# Patient Record
Sex: Male | Born: 1985 | Hispanic: Yes | Marital: Single | Smoking: Current every day smoker
Health system: Southern US, Community
[De-identification: ages and names within clinical notes are randomized; demographics above are authoritative.]

## PROBLEM LIST (undated history)

## (undated) DIAGNOSIS — E119 Type 2 diabetes mellitus without complications: Secondary | ICD-10-CM

---

## 2019-01-28 ENCOUNTER — Other Ambulatory Visit: Payer: Self-pay

## 2019-01-28 ENCOUNTER — Emergency Department (HOSPITAL_COMMUNITY)
Admission: EM | Admit: 2019-01-28 | Discharge: 2019-01-31 | Disposition: A | Discharge status: Other Acute Inpatient Hospital | Payer: Self-pay | Attending: Emergency Medicine | Admitting: Emergency Medicine

## 2019-01-28 ENCOUNTER — Encounter (HOSPITAL_COMMUNITY): Payer: Self-pay | Admitting: Emergency Medicine

## 2019-01-28 DIAGNOSIS — Z79899 Other long term (current) drug therapy: Secondary | ICD-10-CM | POA: Insufficient documentation

## 2019-01-28 DIAGNOSIS — R739 Hyperglycemia, unspecified: Secondary | ICD-10-CM

## 2019-01-28 DIAGNOSIS — E1165 Type 2 diabetes mellitus with hyperglycemia: Secondary | ICD-10-CM | POA: Insufficient documentation

## 2019-01-28 DIAGNOSIS — F332 Major depressive disorder, recurrent severe without psychotic features: Secondary | ICD-10-CM | POA: Insufficient documentation

## 2019-01-28 DIAGNOSIS — F32A Depression, unspecified: Secondary | ICD-10-CM

## 2019-01-28 DIAGNOSIS — F329 Major depressive disorder, single episode, unspecified: Secondary | ICD-10-CM

## 2019-01-28 DIAGNOSIS — Z1159 Encounter for screening for other viral diseases: Secondary | ICD-10-CM | POA: Insufficient documentation

## 2019-01-28 DIAGNOSIS — Z794 Long term (current) use of insulin: Secondary | ICD-10-CM | POA: Insufficient documentation

## 2019-01-28 HISTORY — DX: Type 2 diabetes mellitus without complications: E11.9

## 2019-01-28 LAB — CBC WITH DIFFERENTIAL/PLATELET
Abs Immature Granulocytes: 0.02 10*3/uL (ref 0.00–0.07)
Basophils Absolute: 0.1 10*3/uL (ref 0.0–0.1)
Basophils Relative: 1 %
Eosinophils Absolute: 0.2 10*3/uL (ref 0.0–0.5)
Eosinophils Relative: 2 %
HCT: 44.7 % (ref 39.0–52.0)
Hemoglobin: 14.3 g/dL (ref 13.0–17.0)
Immature Granulocytes: 0 %
Lymphocytes Relative: 24 %
Lymphs Abs: 1.8 10*3/uL (ref 0.7–4.0)
MCH: 28.8 pg (ref 26.0–34.0)
MCHC: 32 g/dL (ref 30.0–36.0)
MCV: 90.1 fL (ref 80.0–100.0)
Monocytes Absolute: 0.5 10*3/uL (ref 0.1–1.0)
Monocytes Relative: 7 %
Neutro Abs: 5.1 10*3/uL (ref 1.7–7.7)
Neutrophils Relative %: 66 %
Platelets: 252 10*3/uL (ref 150–400)
RBC: 4.96 MIL/uL (ref 4.22–5.81)
RDW: 15 % (ref 11.5–15.5)
WBC: 7.6 10*3/uL (ref 4.0–10.5)
nRBC: 0 % (ref 0.0–0.2)

## 2019-01-28 LAB — POCT I-STAT EG7
Acid-base deficit: 2 mmol/L (ref 0.0–2.0)
Bicarbonate: 22 mmol/L (ref 20.0–28.0)
Calcium, Ion: 1.14 mmol/L — ABNORMAL LOW (ref 1.15–1.40)
HCT: 48 % (ref 39.0–52.0)
Hemoglobin: 16.3 g/dL (ref 13.0–17.0)
O2 Saturation: 96 %
Potassium: 3.8 mmol/L (ref 3.5–5.1)
Sodium: 138 mmol/L (ref 135–145)
TCO2: 23 mmol/L (ref 22–32)
pCO2, Ven: 34.7 mmHg — ABNORMAL LOW (ref 44.0–60.0)
pH, Ven: 7.411 (ref 7.250–7.430)
pO2, Ven: 78 mmHg — ABNORMAL HIGH (ref 32.0–45.0)

## 2019-01-28 LAB — URINALYSIS, ROUTINE W REFLEX MICROSCOPIC
Bacteria, UA: NONE SEEN
Bilirubin Urine: NEGATIVE
Glucose, UA: >=500 mg/dL — AB
Hgb urine dipstick: NEGATIVE
Ketones, ur: 20 mg/dL — AB
Leukocytes,Ua: NEGATIVE
Nitrite: NEGATIVE
Protein, ur: NEGATIVE mg/dL
Specific Gravity, Urine: 1.035 — ABNORMAL HIGH (ref 1.005–1.030)
pH: 6 (ref 5.0–8.0)

## 2019-01-28 LAB — RAPID URINE DRUG SCREEN, HOSP PERFORMED
Amphetamines: NOT DETECTED
Barbiturates: NOT DETECTED
Benzodiazepines: NOT DETECTED
Cocaine: NOT DETECTED
Opiates: NOT DETECTED
Tetrahydrocannabinol: POSITIVE — AB

## 2019-01-28 LAB — CBG MONITORING, ED
Glucose-Capillary: 453 mg/dL — ABNORMAL HIGH (ref 70–99)
Glucose-Capillary: 318 mg/dL — ABNORMAL HIGH (ref 70–99)
Glucose-Capillary: 520 mg/dL (ref 70–99)
Glucose-Capillary: 287 mg/dL — ABNORMAL HIGH (ref 70–99)
Glucose-Capillary: 215 mg/dL — ABNORMAL HIGH (ref 70–99)
Glucose-Capillary: 71 mg/dL (ref 70–99)

## 2019-01-28 LAB — COMPREHENSIVE METABOLIC PANEL
ALT: 97 U/L — ABNORMAL HIGH (ref 0–44)
AST: 71 U/L — ABNORMAL HIGH (ref 15–41)
Albumin: 4 g/dL (ref 3.5–5.0)
Alkaline Phosphatase: 72 U/L (ref 38–126)
Anion gap: 14 (ref 5–15)
BUN: 17 mg/dL (ref 6–20)
CO2: 21 mmol/L — ABNORMAL LOW (ref 22–32)
Calcium: 9.6 mg/dL (ref 8.9–10.3)
Chloride: 101 mmol/L (ref 98–111)
Creatinine, Ser: 1 mg/dL (ref 0.61–1.24)
GFR calc Af Amer: >60 mL/min (ref >60)
GFR calc non Af Amer: >60 mL/min (ref >60)
Glucose, Bld: 469 mg/dL — ABNORMAL HIGH (ref 70–99)
Potassium: 3.8 mmol/L (ref 3.5–5.1)
Sodium: 136 mmol/L (ref 135–145)
Total Bilirubin: 0.9 mg/dL (ref 0.3–1.2)
Total Protein: 7.4 g/dL (ref 6.5–8.1)

## 2019-01-28 LAB — SARS CORONAVIRUS 2: SARS Coronavirus 2: NOT DETECTED

## 2019-01-28 LAB — HEMOGLOBIN A1C
Hgb A1c MFr Bld: 10.4 % — ABNORMAL HIGH (ref 4.8–5.6)
Mean Plasma Glucose: 251.78 mg/dL

## 2019-01-28 LAB — ETHANOL: Alcohol, Ethyl (B): <10 mg/dL (ref <10)

## 2019-01-28 LAB — SALICYLATE LEVEL: Salicylate Lvl: <7 mg/dL (ref 2.8–30.0)

## 2019-01-28 LAB — LIPASE, BLOOD: Lipase: 35 U/L (ref 11–51)

## 2019-01-28 LAB — ACETAMINOPHEN LEVEL: Acetaminophen (Tylenol), Serum: <10 ug/mL — ABNORMAL LOW (ref 10–30)

## 2019-01-28 MED ORDER — INSULIN ASPART 100 UNIT/ML ~~LOC~~ SOLN: 0.0000 [IU] | Freq: Three times a day (TID) | SUBCUTANEOUS | Status: DC

## 2019-01-28 MED ORDER — SODIUM CHLORIDE 0.9 % IV BOLUS
1000.0000 mL | Freq: Once | INTRAVENOUS | Status: AC
  Administered 2019-01-28: 1000 mL via INTRAVENOUS

## 2019-01-28 MED ORDER — INSULIN ASPART 100 UNIT/ML ~~LOC~~ SOLN
35.0000 [IU] | Freq: Two times a day (BID) | SUBCUTANEOUS | Status: DC
  Administered 2019-01-28 – 2019-01-29 (×2): 35 [IU] via SUBCUTANEOUS

## 2019-01-28 MED ORDER — QUETIAPINE FUMARATE 50 MG PO TABS
300.0000 mg | ORAL_TABLET | Freq: Two times a day (BID) | ORAL | Status: DC
  Administered 2019-01-28 – 2019-01-31 (×6): 300 mg via ORAL
  Filled 2019-01-28 (×6): qty 2

## 2019-01-28 MED ORDER — BUPRENORPHINE HCL-NALOXONE HCL 8-2 MG SL SUBL
1.0000 | SUBLINGUAL_TABLET | Freq: Two times a day (BID) | SUBLINGUAL | Status: DC
  Administered 2019-01-28 – 2019-01-31 (×6): 1 via SUBLINGUAL
  Filled 2019-01-28 (×6): qty 1

## 2019-01-28 MED ORDER — SERTRALINE HCL 100 MG PO TABS
200.0000 mg | ORAL_TABLET | Freq: Every day | ORAL | Status: DC
  Administered 2019-01-28 – 2019-01-31 (×4): 200 mg via ORAL
  Filled 2019-01-28 (×4): qty 2

## 2019-01-28 MED ORDER — INSULIN ASPART 100 UNIT/ML ~~LOC~~ SOLN
10.0000 [IU] | Freq: Once | SUBCUTANEOUS | Status: AC
  Administered 2019-01-28: 10 [IU] via SUBCUTANEOUS

## 2019-01-28 MED ORDER — TRAZODONE HCL 100 MG PO TABS
100.0000 mg | ORAL_TABLET | Freq: Every day | ORAL | Status: DC
  Administered 2019-01-28 – 2019-01-30 (×3): 100 mg via ORAL
  Filled 2019-01-28 (×3): qty 1

## 2019-01-28 MED ORDER — INSULIN ASPART 100 UNIT/ML ~~LOC~~ SOLN: 0.0000 [IU] | Freq: Every day | SUBCUTANEOUS | Status: DC

## 2019-01-28 NOTE — ED Triage Notes (Signed)
Pt in with c/o high blood sugar, has been without meds x 6 days. Hx of DM2, has been unable to check sugar. C/o dizziness, thirst and HA

## 2019-01-28 NOTE — ED Notes (Signed)
Accepted to [facility] when CBG is less than 300 and EDP cleared

## 2019-01-28 NOTE — ED Notes (Signed)
Security has wanded the patient  

## 2019-01-28 NOTE — ED Notes (Signed)
Pt states that he has not taken any meds for a few days-- hearing voices that tell him to hurt himself and how to do it.  States meds are:  Suboxone 8mg  BID Zoloft 200mg  qd Wellbutrin 300mg  qd Seraquel 300mg  BID Trazadone 100mg  hs Abilify (not sure of dose) Insulin 70/30

## 2019-01-28 NOTE — ED Notes (Signed)
Pt states he was recently in [place] for psych treatment after attempting to hurt himself. Reports he has lost his job, apt and has no family. States he has been out of his psych medication.

## 2019-01-28 NOTE — Progress Notes (Addendum)
Pt accepted to [facility], Bed 307-1, ONCE PATIENT 'S CBG <300 AND PATIENT IS MEDICALLY CLEARED BY EDP- Marvia Pickles, NP is the accepting provider.  Myles Lipps, MD is the attending provider.  Call report to [phone]  Kehinde @ [facility] Psych ED notified.   Pt is Voluntary.  Pt may be transported by [facility]   Pt scheduled  to arrive at [facility] 'S CBG <300 AND PATIENT IS MEDICALLY CLEARED BY EDP.  Areatha Keas. Judi Cong, MSW, [place] Disposition Clinical Social Work [phone] (cell) [phone] (office)

## 2019-01-28 NOTE — BH Assessment (Signed)
Tele Assessment Note   Patient Name: Donald HenleDaniel Hartman MRN: [id number] Referring Physician: Dr. Silverio LayYao Location of Patient: [facility] Location of Provider: Behavioral Health TTS Department  Donald Hartman is an 33 y.o. male. Pt came to the ED reporting issues with his diabetes and MH. Pt reports SI with no plan. Pt reports 1 previous SI attempt. Pt denies HI. Pt reports AVH.  Per Pt he was recently released from [facility] and he was not given prescriptions for his diabetes and MH. The Pt states that he is from [place] but he recently lost his job and home so [facility] [place] [facility] [place]. The Pt is currently homeless. The Pt reports SA. 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 Per Pt he feels he is need of rehab.  Donald Beamravis, NP recommends inpatient treatment.  Diagnosis:  F33.2 MDD  Past Medical History:  Past Medical History:  Diagnosis Date  . Diabetes mellitus without complication (HCC)      Family History: No family history on file.  Social History:  has no history on file for tobacco, alcohol, and drug.  Additional Social History:  Alcohol / Drug Use Pain Medications: please see mar Prescriptions: please see mar Over the Counter: please see mar History of alcohol / drug use?: Yes  CIWA: CIWA-Ar BP: 105/71 Pulse Rate: 92 COWS:    Allergies: Not on File  Home Medications: (Not in a hospital admission)   OB/GYN Status:  No LMP for male patient.  General Assessment Data [facility] Assessment: In system Is this a Tele or Face-to-Face Assessment?: Tele Assessment Is this an Initial Assessment or a Re-assessment for this encounter?: Initial Assessment Patient Accompanied by:: N/A Language Other than English: Yes What is your preferred language: Spanish Living Arrangements: Homeless/Shelter What gender do you identify as?: Male Marital status: Single Maiden name: NA Pregnancy Status: No Living Arrangements: Alone Can pt return to current  living arrangement?: Yes Admission Status: Voluntary Is patient capable of signing voluntary admission?: Yes Referral Source: Self/Family/Friend Insurance type: SP     Crisis Care Plan Living Arrangements: Alone Legal Guardian: Other:(self) Name of Psychiatrist: NA Name of Therapist: NA  Education Status Is patient currently in school?: No Is the patient employed, unemployed or receiving disability?: Unemployed  Risk to self with the past 6 months Suicidal Ideation: Yes-Currently Present Has patient been a risk to self within the past 6 months prior to admission? : Yes Suicidal Intent: Yes-Currently Present Has patient had any suicidal intent within the past 6 months prior to admission? : Yes Is patient at risk for suicide?: Yes Suicidal Plan?: No Has patient had any suicidal plan within the past 6 months prior to admission? : No Access to Means: No What has been your use of drugs/alcohol within the last 12 months?: NA Previous Attempts/Gestures: Yes How many times?: 1 Other Self Harm Risks: NA Triggers for Past Attempts: None known Intentional Self Injurious Behavior: None Family Suicide History: No Recent stressful life event(s): Other (Comment) Persecutory voices/beliefs?: No Depression: Yes Depression Symptoms: Isolating, Fatigue, Loss of interest in usual pleasures, Feeling angry/irritable Substance abuse history and/or treatment for substance abuse?: No Suicide prevention information given to non-admitted patients: Not applicable  Risk to Others within the past 6 months Homicidal Ideation: No Does patient have any lifetime risk of violence toward others beyond the six months prior to admission? : No Thoughts of Harm to Others: No Current Homicidal Intent: No Current Homicidal Plan: No Access  to Homicidal Means: No Identified Victim: NA History of harm to others?: No Assessment of Violence: None Noted Violent Behavior Description: NA Does patient have access  to weapons?: No Criminal Charges Pending?: No Does patient have a court date: No Is patient on probation?: No  Psychosis Hallucinations: None noted Delusions: None noted  Mental Status Report Appearance/Hygiene: Unremarkable Eye Contact: Fair Motor Activity: Freedom of movement Speech: Logical/coherent Level of Consciousness: Alert Mood: Depressed, Anxious Affect: Depressed, Anxious Anxiety Level: Minimal Thought Processes: Coherent, Relevant Judgement: Unimpaired Orientation: Person, Place, Time, Situation Obsessive Compulsive Thoughts/Behaviors: None  Cognitive Functioning Concentration: Normal Memory: Recent Intact, Remote Intact Is patient IDD: No Insight: Fair Impulse Control: Fair Appetite: Fair Have you had any weight changes? : No Change Sleep: No Change Total Hours of Sleep: 8 Vegetative Symptoms: None  ADLScreening [facility] Assessment Services) Patient's cognitive ability adequate to safely complete daily activities?: Yes Patient able to express need for assistance with ADLs?: Yes Independently performs ADLs?: Yes (appropriate for developmental age)  Prior Inpatient Therapy Prior Inpatient Therapy: Yes Prior Therapy Dates: 01/2019 Prior Therapy Facilty/Provider(s): [facility] Reason for Treatment: depression  Prior Outpatient Therapy Prior Outpatient Therapy: No Does patient have an ACCT team?: No Does patient have Intensive In-House Services?  : No Does patient have [facility] services? : No Does patient have P4CC services?: No  ADL Screening (condition at time of admission) Patient's cognitive ability adequate to safely complete daily activities?: Yes Is the patient deaf or have difficulty hearing?: No Does the patient have difficulty seeing, even when wearing glasses/contacts?: No Does the patient have difficulty concentrating, remembering, or making decisions?: No Patient able to express need for assistance with ADLs?: Yes Does the patient have  difficulty dressing or bathing?: No Independently performs ADLs?: Yes (appropriate for developmental age)       Abuse/Neglect Assessment (Assessment to be complete while patient is alone) Abuse/Neglect Assessment Can Be Completed: Yes Physical Abuse: Denies Verbal Abuse: Denies Sexual Abuse: Denies Exploitation of patient/patient's resources: Denies     Regulatory affairs officer (For Healthcare) Does Patient Have a Medical Advance Directive?: Yes Does patient want to make changes to medical advance directive?: No - Patient declined          Disposition:  Disposition Initial Assessment Completed for this Encounter: Yes  This service was provided via telemedicine using a 2-way, interactive audio and video technology.  Names of all persons participating in this telemedicine service and their role in this encounter. Name: Darnelle Maffucci Money Role:   Name:  Role:   Name:  Role:   Name:  Role:     Alli Jasmer D 01/28/2019 11:08 AM

## 2019-01-28 NOTE — ED Notes (Signed)
TTS set up for pt 

## 2019-01-28 NOTE — ED Provider Notes (Signed)
[facility] EMER[facility]ENCY [facility] Provider Note   CSN: [id number] Arrival date & time: 01/28/19  [id number]    History   Chief Complaint Chief Complaint  Patient presents with  . Hyperglycemia    HPI Barrett HenleDaniel Pfeifle is a 33 y.o. male history of diabetes, depression here presenting with suicidal ideation, hyperglycemia.  Patient states that he was traveling from [place] to [place] to visit family.  States that he ran out of his psych meds as well as his 70/30 insulin about a week ago.  Apparently patient was admitted at a psych unit in [place] several days ago and just got released about 4 days ago.  At that time, patient was cutting himself and was very depressed.  Patient states that he is still depressed but does not have any current plans to kill himself.  He states that when he was discharged from the psych unit several days ago, his Seroquel and trazodone and insulin were not refilled.  Denies any fevers or chills.  Patient states that he has been very thirsty and has been urinating frequently.     The history is provided by the patient.    Past Medical History:  Diagnosis Date  . Diabetes mellitus without complication (HCC)     There are no active problems to display for this patient.       Home Medications    Prior to Admission medications   Medication Sig Start Date End Date Taking? Authorizing Provider  insulin aspart (NOVOLOG) 100 UNIT/ML injection Inject 35 Units into the skin 2 (two) times daily before a meal.   Yes [provider]    Family History No family history on file.  Social History Social History   Tobacco Use  . Smoking status: Not on file  Substance Use Topics  . Alcohol use: Not on file  . Drug use: Not on file     Allergies   Patient has no allergy information on record.   Review of Systems Review of Systems  Skin: Positive for wound.  Neurological: Positive for dizziness.  Psychiatric/Behavioral: Positive  for dysphoric mood.  All other systems reviewed and are negative.    Physical Exam Updated Vital Signs BP 105/78   Pulse 87   Temp 98.2 F (36.8 C) (Oral)   Resp 15   Ht 5\' 9"  (1.753 m)   Wt 78.9 kg   SpO2 98%   BMI 25.70 kg/m   Physical Exam Vitals signs and nursing note reviewed.  Constitutional:      Comments: Depressed, slightly dehydrated   HENT:     Head: Normocephalic.     Nose: Nose normal.     Mouth/Throat:     Mouth: Mucous membranes are moist.  Eyes:     Extraocular Movements: Extraocular movements intact.     Pupils: Pupils are equal, round, and reactive to light.  Neck:     Musculoskeletal: Normal range of motion.  Cardiovascular:     Rate and Rhythm: Regular rhythm.     Pulses: Normal pulses.     Comments: Slightly tachycardic  Pulmonary:     Effort: Pulmonary effort is normal.     Breath sounds: Normal breath sounds.  Abdominal:     General: Abdomen is flat.     Palpations: Abdomen is soft.  Musculoskeletal: Normal range of motion.     Comments: Healed cut wound on chest and R forearm, no obvious cellulitis   Skin:    Capillary Refill: Capillary refill  takes less than 2 seconds.     Comments: Healing wounds on chest and R forearm   Neurological:     General: No focal deficit present.  Psychiatric:     Comments: Depressed       ED Treatments / Results  Labs (all labs ordered are listed, but only abnormal results are displayed) Labs Reviewed  COMPREHENSIVE METABOLIC PANEL - Abnormal; Notable for the following components:      Result Value   CO2 21 (*)    Glucose, Bld 469 (*)    AST 71 (*)    ALT 97 (*)    All other components within normal limits  URINALYSIS, ROUTINE W REFLEX MICROSCOPIC - Abnormal; Notable for the following components:   Specific Gravity, Urine 1.035 (*)    Glucose, UA >=500 (*)    Ketones, ur 20 (*)    All other components within normal limits  ACETAMINOPHEN LEVEL - Abnormal; Notable for the following components:    Acetaminophen (Tylenol), Serum <10 (*)    All other components within normal limits  RAPID URINE DRUG SCREEN, HOSP PERFORMED - Abnormal; Notable for the following components:   Tetrahydrocannabinol POSITIVE (*)    All other components within normal limits  CBG MONITORING, ED - Abnormal; Notable for the following components:   Glucose-Capillary 453 (*)    All other components within normal limits  POCT I-STAT EG7 - Abnormal; Notable for the following components:   pCO2, Ven 34.7 (*)    pO2, Ven 78.0 (*)    Calcium, Ion 1.14 (*)    All other components within normal limits  CBG MONITORING, ED - Abnormal; Notable for the following components:   Glucose-Capillary 318 (*)    All other components within normal limits  SARS CORONAVIRUS 2  CBC WITH DIFFERENTIAL/PLATELET  LIPASE, BLOOD  ETHANOL  SALICYLATE LEVEL  HEMOGLOBIN A1C    EKG None  Radiology No results found.  Procedures Procedures (including critical care time)  Medications Ordered in ED Medications  insulin aspart (novoLOG) injection 35 Units (has no administration in time range)  insulin aspart (novoLOG) injection 0-15 Units (has no administration in time range)  insulin aspart (novoLOG) injection 0-5 Units (has no administration in time range)  sodium chloride 0.9 % bolus 1,000 mL (0 mLs Intravenous Stopped 01/28/19 1048)  insulin aspart (novoLOG) injection 10 Units (10 Units Subcutaneous Given 01/28/19 0933)     Initial Impression / Assessment and Plan / ED Course  I have reviewed the triage vital signs and the nursing notes.  Pertinent labs & imaging results that were available during my care of the patient were reviewed by me and considered in my medical decision making (see chart for details).       Kimi Kroft is a 33 y.o. male here with hyperglycemia, depression. Recently admitted to a psych hospital in [place] and still depressed but has no suicidal ideation. Also needs refill for his diabetes meds.  Will check labs, give IVF, insulin.   2:09 PM Glucose decreased to 300 from 469. AG normal. Medically cleared, started back on insulin 70/30. Psych recommend inpatient psych admission   Final Clinical Impressions(s) / ED Diagnoses   Final diagnoses:  None    ED Discharge Orders    None       Drenda Freeze, MD 01/28/19 1410

## 2019-01-28 NOTE — ED Triage Notes (Signed)
Pt reports losing DM medication and has not checked it for about 6 days. Reports pain in his back and feet.

## 2019-01-29 LAB — CBG MONITORING, ED
Glucose-Capillary: 305 mg/dL — ABNORMAL HIGH (ref 70–99)
Glucose-Capillary: 356 mg/dL — ABNORMAL HIGH (ref 70–99)
Glucose-Capillary: 153 mg/dL — ABNORMAL HIGH (ref 70–99)
Glucose-Capillary: 410 mg/dL — ABNORMAL HIGH (ref 70–99)
Glucose-Capillary: 318 mg/dL — ABNORMAL HIGH (ref 70–99)

## 2019-01-29 MED ORDER — BUPROPION HCL ER (XL) 150 MG PO TB24
300.0000 mg | ORAL_TABLET | Freq: Every day | ORAL | Status: DC
  Administered 2019-01-29 – 2019-01-31 (×3): 300 mg via ORAL
  Filled 2019-01-29 (×3): qty 2

## 2019-01-29 MED ORDER — INSULIN ASPART PROT & ASPART (70-30 MIX) 100 UNIT/ML ~~LOC~~ SUSP
35.0000 [IU] | Freq: Two times a day (BID) | SUBCUTANEOUS | Status: DC
  Administered 2019-01-29 – 2019-01-30 (×2): 35 [IU] via SUBCUTANEOUS
  Filled 2019-01-29: qty 10

## 2019-01-29 NOTE — ED Notes (Signed)
Pt asking when he will be able to go to [facility]. Told pt that once his blood sugars stay below 300 for a while, he will be able to go. Pt asking for his suboxone.

## 2019-01-29 NOTE — ED Notes (Signed)
Patient was given a Snack and drink. 

## 2019-01-29 NOTE — Progress Notes (Signed)
Patient is still pending bed 307-1 at [facility] but blood sugars need to be controlled more consistently under 300. Reconfirmed with Lowella Petties, RN@[facility]   Donald Hartman. Judi Cong, MSW, [facility] Disposition Clinical Social Work [phone] (cell) [phone] (office)

## 2019-01-29 NOTE — Progress Notes (Signed)
Inpatient Diabetes Program Recommendations  AACE/ADA: New Consensus Statement on Inpatient Glycemic Control (2015)  Target Ranges:  Prepandial:   less than 140 mg/dL      Peak postprandial:   less than 180 mg/dL (1-2 hours)      Critically ill patients:  140 - 180 mg/dL   Lab Results  Component Value Date   GLUCAP 153 (H) 01/29/2019   HGBA1C 10.4 (H) 01/28/2019    Review of Glycemic Control  Inpatient Diabetes Program Recommendations:   -Decrease Novolog 70/30 insulin to 80% home insulin dose = 25 units bid -Add Novolog sensitive correction 0-9 tid  Thank you, Nani Gasser. Kamalei Roeder, RN, MSN, CDE  Diabetes Coordinator Inpatient Glycemic Control Team Team Pager [phone] (8am-5pm) 01/29/2019 1:29 PM

## 2019-01-29 NOTE — ED Notes (Signed)
Pt requesting another snack, at 1430 received snack of cheese and cookies with coffee. Snack times reinforced to pt.

## 2019-01-29 NOTE — ED Notes (Signed)
Pt's last drink was three days ago, will continue to monitor withdrawal symptoms

## 2019-01-29 NOTE — ED Notes (Signed)
Updated BH's AC on pt's CBGs and insulin regimen

## 2019-01-29 NOTE — ED Notes (Signed)
Informed Dr Vanita Panda of pt's sugar, continue to hold sliding scale until lunchtime and recheck then

## 2019-01-29 NOTE — ED Notes (Signed)
Discussed pt's insulin orders with Dr Vanita Panda. Per Dr Vanita Panda hold sliding scale with breakfast and give pt's typical 35u dose, reassess at lunchtime. Per pt normal insulin regimin is 35u twice daily.

## 2019-01-29 NOTE — ED Notes (Signed)
Patient was given Cheese and Short Bread Cookies, and Cup of Black Coffee.

## 2019-01-29 NOTE — ED Notes (Signed)
Patient denies pain and is resting comfortably.  

## 2019-01-29 NOTE — ED Notes (Signed)
Snack and drink provided to pt.  

## 2019-01-30 ENCOUNTER — Encounter (HOSPITAL_COMMUNITY): Payer: Self-pay | Admitting: Registered Nurse

## 2019-01-30 LAB — CBG MONITORING, ED
Glucose-Capillary: 318 mg/dL — ABNORMAL HIGH (ref 70–99)
Glucose-Capillary: 451 mg/dL — ABNORMAL HIGH (ref 70–99)
Glucose-Capillary: 233 mg/dL — ABNORMAL HIGH (ref 70–99)
Glucose-Capillary: 72 mg/dL (ref 70–99)
Glucose-Capillary: 119 mg/dL — ABNORMAL HIGH (ref 70–99)
Glucose-Capillary: 57 mg/dL — ABNORMAL LOW (ref 70–99)
Glucose-Capillary: 79 mg/dL (ref 70–99)

## 2019-01-30 MED ORDER — INSULIN ASPART 100 UNIT/ML ~~LOC~~ SOLN
10.0000 [IU] | Freq: Once | SUBCUTANEOUS | Status: AC
  Administered 2019-01-30: 10 [IU] via SUBCUTANEOUS

## 2019-01-30 MED ORDER — INSULIN ASPART 100 UNIT/ML ~~LOC~~ SOLN: 2.0000 [IU] | Freq: Three times a day (TID) | SUBCUTANEOUS | Status: DC

## 2019-01-30 MED ORDER — INSULIN ASPART PROT & ASPART (70-30 MIX) 100 UNIT/ML ~~LOC~~ SUSP
40.0000 [IU] | Freq: Two times a day (BID) | SUBCUTANEOUS | Status: DC
  Administered 2019-01-30 – 2019-01-31 (×2): 40 [IU] via SUBCUTANEOUS
  Filled 2019-01-30: qty 10

## 2019-01-30 NOTE — ED Notes (Signed)
Per Huel Coventry at [facility] patient may go over after shift change

## 2019-01-30 NOTE — ED Provider Notes (Signed)
Over the past 2 days the patient has had multiple episodes of hyperglycemia. These may be due in part at least to his inconsistent reporting of his home insulin regimen. After several conversations with nursing, pharmacy technicians, patient is seemingly on the appropriate home regimen, with both 7030 and sliding scale.   Donald Muskrat, MD 01/30/19 1332

## 2019-01-30 NOTE — ED Notes (Signed)
Checked patient cbg at 8:53 it was 41 notified RN of blood sugar

## 2019-01-30 NOTE — ED Notes (Signed)
Dr Vanita Panda ordered home insulin sliding scale:  SLIDING SCALE- 150-2 UNITS. 200-4 UNITS  250-6 UNITS  300-400 8 UNITS . 400 12 UNITS 500-600 15 UNITS.

## 2019-01-30 NOTE — ED Notes (Signed)
Per [facility] at [facility] pt may go over to [facility] if sugars continue to trend/stabilize around dinner

## 2019-01-30 NOTE — ED Notes (Signed)
Breakfast tray ordered 

## 2019-01-30 NOTE — ED Notes (Signed)
Checked patient cbg it was 62 notified RN of blood sugar patient is resting with sitter at bedside

## 2019-01-30 NOTE — ED Notes (Signed)
Per [facility] pt will not be able to be transfer there until tomorrow at 8 am if his sugar is below the 300.

## 2019-01-30 NOTE — ED Notes (Signed)
Report attempted, per [facility] pt needs to be at least 24 hours with CBG below 300 and pt was 400 this morning she will consult with the [facility] to find out if is ok for him to leave.

## 2019-01-31 ENCOUNTER — Inpatient Hospital Stay (HOSPITAL_COMMUNITY)
Admission: AD | Admit: 2019-01-31 | Discharge: 2019-02-04 | DRG: 885 | Disposition: A | Discharge status: Home / Self Care | Payer: Federal, State, Local not specified - Other | Source: Intra-hospital | Attending: Psychiatry | Admitting: Psychiatry

## 2019-01-31 ENCOUNTER — Other Ambulatory Visit: Payer: Self-pay

## 2019-01-31 ENCOUNTER — Encounter (HOSPITAL_COMMUNITY): Payer: Self-pay | Admitting: *Deleted

## 2019-01-31 DIAGNOSIS — F1123 Opioid dependence with withdrawal: Secondary | ICD-10-CM | POA: Diagnosis present

## 2019-01-31 DIAGNOSIS — G4700 Insomnia, unspecified: Secondary | ICD-10-CM | POA: Diagnosis present

## 2019-01-31 DIAGNOSIS — Z79899 Other long term (current) drug therapy: Secondary | ICD-10-CM

## 2019-01-31 DIAGNOSIS — E1065 Type 1 diabetes mellitus with hyperglycemia: Secondary | ICD-10-CM | POA: Diagnosis present

## 2019-01-31 DIAGNOSIS — R45851 Suicidal ideations: Secondary | ICD-10-CM | POA: Diagnosis present

## 2019-01-31 DIAGNOSIS — T402X5A Adverse effect of other opioids, initial encounter: Secondary | ICD-10-CM | POA: Diagnosis present

## 2019-01-31 DIAGNOSIS — R41843 Psychomotor deficit: Secondary | ICD-10-CM | POA: Diagnosis present

## 2019-01-31 DIAGNOSIS — F410 Panic disorder [episodic paroxysmal anxiety] without agoraphobia: Secondary | ICD-10-CM | POA: Diagnosis present

## 2019-01-31 DIAGNOSIS — Z915 Personal history of self-harm: Secondary | ICD-10-CM

## 2019-01-31 DIAGNOSIS — F332 Major depressive disorder, recurrent severe without psychotic features: Principal | ICD-10-CM | POA: Diagnosis present

## 2019-01-31 DIAGNOSIS — F1124 Opioid dependence with opioid-induced mood disorder: Secondary | ICD-10-CM

## 2019-01-31 DIAGNOSIS — Z794 Long term (current) use of insulin: Secondary | ICD-10-CM

## 2019-01-31 DIAGNOSIS — R440 Auditory hallucinations: Secondary | ICD-10-CM | POA: Diagnosis present

## 2019-01-31 LAB — CBG MONITORING, ED
Glucose-Capillary: 66 mg/dL — ABNORMAL LOW (ref 70–99)
Glucose-Capillary: 99 mg/dL (ref 70–99)
Glucose-Capillary: 201 mg/dL — ABNORMAL HIGH (ref 70–99)
Glucose-Capillary: 220 mg/dL — ABNORMAL HIGH (ref 70–99)
Glucose-Capillary: 86 mg/dL (ref 70–99)

## 2019-01-31 LAB — GLUCOSE, CAPILLARY
Glucose-Capillary: 447 mg/dL — ABNORMAL HIGH (ref 70–99)
Glucose-Capillary: 155 mg/dL — ABNORMAL HIGH (ref 70–99)

## 2019-01-31 MED ORDER — VITAMIN B-1 100 MG PO TABS
100.0000 mg | ORAL_TABLET | Freq: Every day | ORAL | Status: DC
  Administered 2019-01-31 – 2019-02-04 (×5): 100 mg via ORAL
  Filled 2019-01-31 (×8): qty 1

## 2019-01-31 MED ORDER — CLONIDINE HCL 0.1 MG PO TABS
0.1000 mg | ORAL_TABLET | Freq: Four times a day (QID) | ORAL | Status: AC
  Administered 2019-01-31 – 2019-02-02 (×6): 0.1 mg via ORAL
  Filled 2019-01-31 (×12): qty 1

## 2019-01-31 MED ORDER — QUETIAPINE FUMARATE 300 MG PO TABS
300.0000 mg | ORAL_TABLET | Freq: Every day | ORAL | Status: DC
  Administered 2019-02-01 – 2019-02-04 (×4): 300 mg via ORAL
  Filled 2019-01-31 (×3): qty 1
  Filled 2019-01-31: qty 7
  Filled 2019-01-31 (×3): qty 1
  Filled 2019-01-31: qty 7
  Filled 2019-01-31: qty 1

## 2019-01-31 MED ORDER — LOPERAMIDE HCL 2 MG PO CAPS: 2.0000 mg | ORAL_CAPSULE | ORAL | Status: DC | PRN

## 2019-01-31 MED ORDER — CLONIDINE HCL 0.1 MG PO TABS
0.1000 mg | ORAL_TABLET | Freq: Every day | ORAL | Status: DC
  Filled 2019-01-31 (×2): qty 1

## 2019-01-31 MED ORDER — MAGNESIUM HYDROXIDE 400 MG/5ML PO SUSP: 30.0000 mL | Freq: Every day | ORAL | Status: DC | PRN

## 2019-01-31 MED ORDER — CHLORDIAZEPOXIDE HCL 25 MG PO CAPS
25.0000 mg | ORAL_CAPSULE | Freq: Four times a day (QID) | ORAL | Status: DC | PRN
  Administered 2019-01-31 – 2019-02-04 (×8): 25 mg via ORAL
  Filled 2019-01-31 (×8): qty 1

## 2019-01-31 MED ORDER — TRAZODONE HCL 50 MG PO TABS: 50.0000 mg | ORAL_TABLET | Freq: Every evening | ORAL | Status: DC | PRN

## 2019-01-31 MED ORDER — METHOCARBAMOL 500 MG PO TABS
500.0000 mg | ORAL_TABLET | Freq: Three times a day (TID) | ORAL | Status: DC | PRN
  Administered 2019-01-31 – 2019-02-01 (×2): 500 mg via ORAL
  Filled 2019-01-31 (×2): qty 1

## 2019-01-31 MED ORDER — QUETIAPINE FUMARATE 400 MG PO TABS
400.0000 mg | ORAL_TABLET | Freq: Every day | ORAL | Status: DC
  Administered 2019-01-31 – 2019-02-03 (×3): 400 mg via ORAL
  Filled 2019-01-31: qty 1
  Filled 2019-01-31: qty 2
  Filled 2019-01-31 (×2): qty 1
  Filled 2019-01-31: qty 2
  Filled 2019-01-31 (×2): qty 1

## 2019-01-31 MED ORDER — OLANZAPINE 10 MG PO TBDP
10.0000 mg | ORAL_TABLET | Freq: Three times a day (TID) | ORAL | Status: DC | PRN
  Administered 2019-01-31 – 2019-02-02 (×3): 10 mg via ORAL
  Filled 2019-01-31 (×3): qty 1

## 2019-01-31 MED ORDER — INSULIN ASPART 100 UNIT/ML ~~LOC~~ SOLN
0.0000 [IU] | Freq: Three times a day (TID) | SUBCUTANEOUS | Status: DC
  Administered 2019-01-31 – 2019-02-01 (×2): 15 [IU] via SUBCUTANEOUS

## 2019-01-31 MED ORDER — CLONIDINE HCL 0.1 MG PO TABS
0.1000 mg | ORAL_TABLET | ORAL | Status: DC
  Administered 2019-02-03 – 2019-02-04 (×3): 0.1 mg via ORAL
  Filled 2019-01-31 (×4): qty 1

## 2019-01-31 MED ORDER — INSULIN ASPART 100 UNIT/ML ~~LOC~~ SOLN: 0.0000 [IU] | Freq: Three times a day (TID) | SUBCUTANEOUS | Status: DC

## 2019-01-31 MED ORDER — NAPROXEN 500 MG PO TABS
500.0000 mg | ORAL_TABLET | Freq: Two times a day (BID) | ORAL | Status: DC | PRN
  Administered 2019-01-31 – 2019-02-01 (×2): 500 mg via ORAL
  Filled 2019-01-31 (×2): qty 1

## 2019-01-31 MED ORDER — FOLIC ACID 1 MG PO TABS
1.0000 mg | ORAL_TABLET | Freq: Every day | ORAL | Status: DC
  Administered 2019-01-31 – 2019-02-04 (×5): 1 mg via ORAL
  Filled 2019-01-31 (×8): qty 1

## 2019-01-31 MED ORDER — LORAZEPAM 1 MG PO TABS
1.0000 mg | ORAL_TABLET | ORAL | Status: AC | PRN
  Administered 2019-01-31: 1 mg via ORAL
  Filled 2019-01-31: qty 1

## 2019-01-31 MED ORDER — BUPROPION HCL ER (XL) 300 MG PO TB24
300.0000 mg | ORAL_TABLET | Freq: Every day | ORAL | Status: DC
  Administered 2019-02-01 – 2019-02-04 (×4): 300 mg via ORAL
  Filled 2019-01-31 (×2): qty 1
  Filled 2019-01-31: qty 7
  Filled 2019-01-31: qty 1
  Filled 2019-01-31: qty 7
  Filled 2019-01-31 (×3): qty 1

## 2019-01-31 MED ORDER — INSULIN ASPART PROT & ASPART (70-30 MIX) 100 UNIT/ML ~~LOC~~ SUSP: 40.0000 [IU] | Freq: Two times a day (BID) | SUBCUTANEOUS | Status: DC

## 2019-01-31 MED ORDER — DICYCLOMINE HCL 20 MG PO TABS
20.0000 mg | ORAL_TABLET | Freq: Four times a day (QID) | ORAL | Status: DC | PRN
  Administered 2019-02-01: 20 mg via ORAL
  Filled 2019-01-31: qty 1

## 2019-01-31 MED ORDER — QUETIAPINE FUMARATE 300 MG PO TABS
300.0000 mg | ORAL_TABLET | Freq: Every day | ORAL | Status: DC
  Filled 2019-01-31: qty 1

## 2019-01-31 MED ORDER — ONDANSETRON 4 MG PO TBDP
4.0000 mg | ORAL_TABLET | Freq: Four times a day (QID) | ORAL | Status: DC | PRN
  Administered 2019-02-01: 4 mg via ORAL
  Filled 2019-01-31: qty 1

## 2019-01-31 MED ORDER — SERTRALINE HCL 100 MG PO TABS
100.0000 mg | ORAL_TABLET | Freq: Every day | ORAL | Status: DC
  Administered 2019-02-01 – 2019-02-04 (×4): 100 mg via ORAL
  Filled 2019-01-31: qty 1
  Filled 2019-01-31: qty 7
  Filled 2019-01-31 (×4): qty 1
  Filled 2019-01-31: qty 7
  Filled 2019-01-31: qty 1

## 2019-01-31 MED ORDER — HYDROXYZINE HCL 25 MG PO TABS: 25.0000 mg | ORAL_TABLET | Freq: Three times a day (TID) | ORAL | Status: DC | PRN

## 2019-01-31 MED ORDER — TRAZODONE HCL 100 MG PO TABS
100.0000 mg | ORAL_TABLET | Freq: Every evening | ORAL | Status: DC | PRN
  Administered 2019-01-31 – 2019-02-03 (×3): 100 mg via ORAL
  Filled 2019-01-31 (×2): qty 1
  Filled 2019-01-31: qty 7
  Filled 2019-01-31 (×3): qty 1

## 2019-01-31 MED ORDER — INSULIN ASPART PROT & ASPART (70-30 MIX) 100 UNIT/ML ~~LOC~~ SUSP
25.0000 [IU] | Freq: Two times a day (BID) | SUBCUTANEOUS | Status: DC
  Administered 2019-01-31: 25 [IU] via SUBCUTANEOUS

## 2019-01-31 MED ORDER — BUPRENORPHINE HCL-NALOXONE HCL 8-2 MG SL SUBL: 1.0000 | SUBLINGUAL_TABLET | Freq: Every day | SUBLINGUAL | Status: DC

## 2019-01-31 MED ORDER — HYDROXYZINE HCL 25 MG PO TABS
25.0000 mg | ORAL_TABLET | Freq: Four times a day (QID) | ORAL | Status: DC | PRN
  Administered 2019-01-31 – 2019-02-02 (×3): 25 mg via ORAL
  Filled 2019-01-31 (×4): qty 1

## 2019-01-31 MED ORDER — INSULIN ASPART 100 UNIT/ML ~~LOC~~ SOLN: 0.0000 [IU] | Freq: Every day | SUBCUTANEOUS | Status: DC

## 2019-01-31 MED ORDER — ACETAMINOPHEN 325 MG PO TABS: 650.0000 mg | ORAL_TABLET | Freq: Four times a day (QID) | ORAL | Status: DC | PRN

## 2019-01-31 MED ORDER — TRAZODONE HCL 100 MG PO TABS
100.0000 mg | ORAL_TABLET | Freq: Every day | ORAL | Status: DC
  Filled 2019-01-31: qty 1

## 2019-01-31 MED ORDER — ZIPRASIDONE MESYLATE 20 MG IM SOLR
20.0000 mg | INTRAMUSCULAR | Status: DC | PRN
  Filled 2019-01-31: qty 20

## 2019-01-31 MED ORDER — ALUM & MAG HYDROXIDE-SIMETH 200-200-20 MG/5ML PO SUSP
30.0000 mL | ORAL | Status: DC | PRN
  Administered 2019-02-01: 30 mL via ORAL
  Filled 2019-01-31: qty 30

## 2019-01-31 MED ORDER — SERTRALINE HCL 100 MG PO TABS
200.0000 mg | ORAL_TABLET | Freq: Every day | ORAL | Status: DC
  Filled 2019-01-31: qty 2

## 2019-01-31 NOTE — ED Notes (Signed)
RN contacted [facility] to give Report.. Per RN Patty. Pt is currently unable to come over d/t unstable glucose control

## 2019-01-31 NOTE — Progress Notes (Signed)
[facility] Notes:  (Nursing/MHT/Case Management/Adjunct)  Date:  01/31/2019  Time:  2030  Type of Therapy:  wrap up group  Participation Level:  Active  Participation Quality:  Appropriate, Attentive and Sharing  Affect:  Anxious and Appropriate  Cognitive:  Alert  Insight:  Improving  Engagement in Group:  Engaged  Modes of Intervention:  Clarification, Education and Support  Summary of Progress/Problems: Pt shares that he lost his job due to Covid 19 and therefore lost his housing unable to pay rent. Pt plans on getting his life straight when he leaves finding a job and get off the street. Pt is looking forward to talking to social worker tomorrow, Pt reports being grateful for being here getting help and for his mother and sister who live in [place].  Shellia Cleverly 01/31/2019, 9:33 PM

## 2019-01-31 NOTE — Tx Team (Signed)
Initial Treatment Plan 01/31/2019 3:05 PM Donald Hartman AXE:[id number]    PATIENT STRESSORS: Educational concerns Financial difficulties Health problems   PATIENT STRENGTHS: Ability for insight Active sense of humor Average or above average intelligence   PATIENT IDENTIFIED PROBLEMS: MDD with SI  Substance Abuse              " I dont have anybody"  " " I dont have any money"   DISCHARGE CRITERIA:  Ability to meet basic life and health needs Adequate post-discharge living arrangements Improved stabilization in mood, thinking, and/or behavior  PRELIMINARY DISCHARGE PLAN: Attend aftercare/continuing care group Attend PHP/IOP Attend 12-step recovery group  PATIENT/FAMILY INVOLVEMENT: This treatment plan has been presented to and reviewed with the patient, Donald Hartman, and/or family member, .  The patient and family have been given the opportunity to ask questions and make suggestions.  Lauralyn Primes, RN 01/31/2019, 3:05 PM

## 2019-01-31 NOTE — ED Notes (Signed)
RN informed Pt can come [facility] at 1300
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
## 2019-02-01 DIAGNOSIS — F1010 Alcohol abuse, uncomplicated: Secondary | ICD-10-CM

## 2019-02-01 DIAGNOSIS — F1410 Cocaine abuse, uncomplicated: Secondary | ICD-10-CM

## 2019-02-01 DIAGNOSIS — F1110 Opioid abuse, uncomplicated: Secondary | ICD-10-CM | POA: Diagnosis not present

## 2019-02-01 DIAGNOSIS — F332 Major depressive disorder, recurrent severe without psychotic features: Secondary | ICD-10-CM | POA: Diagnosis not present

## 2019-02-01 LAB — COMPREHENSIVE METABOLIC PANEL
ALT: 104 U/L — ABNORMAL HIGH (ref 0–44)
AST: 86 U/L — ABNORMAL HIGH (ref 15–41)
Albumin: 3.5 g/dL (ref 3.5–5.0)
Alkaline Phosphatase: 60 U/L (ref 38–126)
Anion gap: 8 (ref 5–15)
BUN: 14 mg/dL (ref 6–20)
CO2: 29 mmol/L (ref 22–32)
Calcium: 8.9 mg/dL (ref 8.9–10.3)
Chloride: 101 mmol/L (ref 98–111)
Creatinine, Ser: 1.1 mg/dL (ref 0.61–1.24)
GFR calc Af Amer: >60 mL/min (ref >60)
GFR calc non Af Amer: >60 mL/min (ref >60)
Glucose, Bld: 301 mg/dL — ABNORMAL HIGH (ref 70–99)
Potassium: 4 mmol/L (ref 3.5–5.1)
Sodium: 138 mmol/L (ref 135–145)
Total Bilirubin: 0.2 mg/dL — ABNORMAL LOW (ref 0.3–1.2)
Total Protein: 6.4 g/dL — ABNORMAL LOW (ref 6.5–8.1)

## 2019-02-01 LAB — GLUCOSE, CAPILLARY
Glucose-Capillary: 396 mg/dL — ABNORMAL HIGH (ref 70–99)
Glucose-Capillary: 341 mg/dL — ABNORMAL HIGH (ref 70–99)
Glucose-Capillary: 167 mg/dL — ABNORMAL HIGH (ref 70–99)
Glucose-Capillary: 338 mg/dL — ABNORMAL HIGH (ref 70–99)
Glucose-Capillary: 173 mg/dL — ABNORMAL HIGH (ref 70–99)

## 2019-02-01 MED ORDER — INSULIN ASPART 100 UNIT/ML ~~LOC~~ SOLN
0.0000 [IU] | Freq: Three times a day (TID) | SUBCUTANEOUS | Status: DC
  Administered 2019-02-01 (×2): 15 [IU] via SUBCUTANEOUS
  Administered 2019-02-02 (×2): 20 [IU] via SUBCUTANEOUS
  Administered 2019-02-02: 7 [IU] via SUBCUTANEOUS
  Administered 2019-02-03: 15 [IU] via SUBCUTANEOUS
  Administered 2019-02-03: 7 [IU] via SUBCUTANEOUS
  Administered 2019-02-03 – 2019-02-04 (×2): 20 [IU] via SUBCUTANEOUS

## 2019-02-01 MED ORDER — PNEUMOCOCCAL VAC POLYVALENT 25 MCG/0.5ML IJ INJ: 0.5000 mL | INJECTION | INTRAMUSCULAR | Status: DC

## 2019-02-01 MED ORDER — INSULIN ASPART PROT & ASPART (70-30 MIX) 100 UNIT/ML ~~LOC~~ SUSP
35.0000 [IU] | Freq: Two times a day (BID) | SUBCUTANEOUS | Status: DC
  Administered 2019-02-01 – 2019-02-02 (×2): 35 [IU] via SUBCUTANEOUS

## 2019-02-01 MED ORDER — INSULIN ASPART 100 UNIT/ML ~~LOC~~ SOLN
0.0000 [IU] | Freq: Every day | SUBCUTANEOUS | Status: DC
  Administered 2019-02-02: 5 [IU] via SUBCUTANEOUS
  Administered 2019-02-03: 4 [IU] via SUBCUTANEOUS

## 2019-02-01 MED ORDER — INSULIN ASPART PROT & ASPART (70-30 MIX) 100 UNIT/ML ~~LOC~~ SUSP
30.0000 [IU] | Freq: Two times a day (BID) | SUBCUTANEOUS | Status: DC
  Administered 2019-02-01: 30 [IU] via SUBCUTANEOUS
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
## 2019-02-01 NOTE — Progress Notes (Signed)
D: Patient's BP has been low this morning.  Patient has been advised to eat/drink, get out of bed and attend groups, etc.  Clonidine was not given to him at 0800 and 1200 because of low BP.  PRN's were given to help with pain/anxiety, agitation.  Patient has been encouraged to get out of bed and attend group and activities. A:  Medications administered per MD orders.  Emotional support and encouragement given patient. R:  Safety maintained with 15 minute checks.

## 2019-02-01 NOTE — Progress Notes (Signed)
Patient did not attend wrap up group. 

## 2019-02-01 NOTE — Progress Notes (Signed)
D: Pt was in dayroom upon initial approach.  Pt presents with labile affect and mood.  He is paranoid, anxious, and irritable upon initial assessment.  He denies SI/HI.  He reports "I feel like people are following me, like shadows.  I need my sugar checked."  Regarding his roommate, pt states "I don't want him to steal my stuff."  His goal is to "try to sleep."  While in milieu, he interacts with staff minimally and he is watchful of staff and other patients.  Pt is seen frequently looking behind him while in the dayroom.  Pt complains of generalized pain of 8/10.    A: Introduced self to pt.  Met with pt 1:1.  Actively listened to pt and offered support and encouragement.  Medications administered per order.  PRN medication administered for agitation, anxiety, pain, muscle spasms, and sleep.  Fall prevention techniques reviewed with pt and he verbalized understanding.  15 minute safety checks in place.  R: Pt is safe on the unit.  Pt is compliant with medications.  Pt verbally contracts for safety and reports he will inform staff of needs and concerns.  Will continue to monitor and assess.

## 2019-02-01 NOTE — [facility] Suicide Risk Assessment (Signed)
[facility]   Nursing information obtained from:  Patient Demographic factors:  Male, Low socioeconomic status, Living alone, Unemployed Current Mental Status:  Suicidal ideation indicated by patient Loss Factors:  Decrease in vocational status, Loss of significant relationship, Decline in physical health, Financial problems / change in socioeconomic status Historical Factors:  Prior suicide attempts Risk Reduction Factors:  NA  Total Time spent with patient: 30 minutes Principal Problem: <principal problem not specified> Diagnosis:  Active Problems:   MDD (major depressive disorder), recurrent episode, severe (HCC)  Subjective Data: Patient is seen and examined.  Patient is a 33 year old male with a past psychiatric history significant for polysubstance dependence (cocaine, heroin and alcohol) who presented to the [facility] emergency department on 01/28/2019 with suicidal ideation.  The patient is from [place], and stated that he had been in a hospital in [place] shortly before heading to [place].  Apparently the hospital in [place] got him a bus ticket to go to [place] to stay with his mother.  Somehow or another he lost his bus ticket and ended up getting stuck in [place].  He was admitted to the hospital at [facility] and was there for several days and then discharged.  He stated that he had not been given prescriptions for his diabetes medications or his psychiatric medicines.  Somehow or another he got to [place], and then went to [facility].  He stated he has been admitted to the psychiatric hospital on multiple occasions.  He stated he has been on the Suboxone for several years and was first started on it somewhere in [place] 3 years ago.  He was diagnosed with type 1 diabetes while he was a teenager.  He is essentially homeless, and stated that he wanted to go to substance rehabilitation, and had no means of being able to get  back to [place] or go to [place].  He was admitted to the hospital for evaluation and stabilization.  Continued Clinical Symptoms:  Alcohol Use Disorder Identification Test Final Score (AUDIT): 24 The "Alcohol Use Disorders Identification Test", Guidelines for Use in Primary Care, Second Edition.  World Science writerHealth Organization [facility](WHO). Score between 0-7:  no or low risk or alcohol related problems. Score between 8-15:  moderate risk of alcohol related problems. Score between 16-19:  high risk of alcohol related problems. Score 20 or above:  warrants further diagnostic evaluation for alcohol dependence and treatment.   CLINICAL FACTORS:   Depression:   Anhedonia Comorbid alcohol abuse/dependence Hopelessness Impulsivity Insomnia Alcohol/Substance Abuse/Dependencies   Musculoskeletal: Strength & Muscle Tone: within normal limits Gait & Station: normal Patient leans: N/A  Psychiatric Specialty Exam: Physical Exam  Nursing note and vitals reviewed. Constitutional: He is oriented to person, place, and time. He appears well-developed and well-nourished.  HENT:  Head: Normocephalic and atraumatic.  Respiratory: Effort normal.  Neurological: He is alert and oriented to person, place, and time.    ROS  Blood pressure (!) 86/62, pulse (!) 109, temperature 98.1 F (36.7 C), temperature source Oral, resp. rate 16, height 5\' 9"  (1.753 m), weight 78.9 kg, SpO2 98 %.Body mass index is 25.7 kg/m.  General Appearance: Disheveled  Eye Contact:  Minimal  Speech:  Normal Rate  Volume:  Decreased  Mood:  Depressed and Dysphoric  Affect:  Congruent  Thought Process:  Coherent and Descriptions of Associations: Circumstantial  Orientation:  Full (Time, Place, and Person)  Thought Content:  Logical  Suicidal Thoughts:  Yes.  without intent/plan  Homicidal Thoughts:  No  Memory:  Immediate;   Fair Recent;   Fair Remote;   Fair  Judgement:  Impaired  Insight:  Lacking  Psychomotor Activity:   Increased  Concentration:  Concentration: Fair and Attention Span: Fair  Recall:  [employer] of Knowledge:  Fair  Language:  Fair  Akathisia:  Negative  Handed:  Right  AIMS (if indicated):     Assets:  Desire for Improvement Resilience  ADL's:  Intact  Cognition:  WNL  Sleep:  Number of Hours: 4.5      COGNITIVE FEATURES THAT CONTRIBUTE TO RISK:  None    SUICIDE RISK:   Mild:  Suicidal ideation of limited frequency, intensity, duration, and specificity.  There are no identifiable plans, no associated intent, mild dysphoria and related symptoms, good self-control (both objective and subjective assessment), few other risk factors, and identifiable protective factors, including available and accessible social support.  PLAN OF CARE: Patient is seen and examined.  Patient is a 33 year old male with the above-stated past psychiatric history who was admitted secondary to suicidal ideation.  He will be admitted to the hospital.  He will be integrated into the milieu.  He will be encouraged to attend groups.  At [facility] he was apparently on Wellbutrin XL, Seroquel and Suboxone.  I told the patient since we were unable to ensure that he would be able to get Suboxone after his discharge from the hospital that I would wean him off of it.  He was placed on the opiate detox protocol yesterday.  Additionally, he has been drinking alcohol, and although I believe that his risk for withdrawal is minimal since he was out of the hospital for 4 days we will place him on Librium 25 mg p.o. 4 times daily PRN a CIWA greater than 10.  He has poorly controlled diabetes mellitus, and was held in the emergency department for 3 days attempting to get his blood sugar under control.  Unfortunately his blood sugar still under poor control.  He was started on the NovoLog insulin per recommendations a diabetic teaching nurse.  His blood sugar this morning is still 396.  I have changed him to the severe sliding scale, and  increased his 70/30 insulin to 30 units subcu twice daily.  He will also get nighttime coverage.  He is requesting rehabilitation/residential treatment, but resources are limited because he is from [place], not a [place] resident.  He will meet with social work and see what his options are.  Review of his laboratories were significant for his poorly controlled diabetes, and mildly elevated LFTs with an AST of 71 and an ALT of 97.  These labs were from 6/18, and will be repeated this afternoon.  His hemoglobin A1c is 10.4 and his urine had greater than 500 mg per DL of glucose as well as ketones.  Drug screen was positive for marijuana as well.  Blood alcohol was less than 10.  He is mildly tachycardic and mildly hypotensive this morning.  His blood pressure is 86/62, his heart rate is 109.  His oxygen saturation was 98% on room air.  Hopefully we can stabilize the situation.  I certify that inpatient services furnished can reasonably be expected to improve the patient's condition.   Sharma Covert, MD 02/01/2019, 7:43 AM

## 2019-02-01 NOTE — Progress Notes (Signed)
Spiritual care group on grief and loss facilitated by chaplain Jerene Pitch  Group Goal:  Support / Education around grief and loss Members engage in facilitated group support and psycho social education.  Group Description:  Following introductions and group rules,  Group members engaged in facilitated group dialog and support around topic of loss, with particular support around experiences of loss in their lives. Group Identified types of loss (relationships / self / things) and identified patterns, circumstances, and changes that precipitate losses. Reflected on thoughts / feelings around loss, normalized grief responses, and recognized variety in grief experience. Patient Progress:  Did not attend

## 2019-02-01 NOTE — Plan of Care (Signed)
D: Patient is alert, anxious, and cooperative. Denies SI, HI, AVH, and verbally contracts for safety. Patient reports he is feeling physical withdrawal symptoms.   A: Medications administered per MD order. Support provided. Patient educated on safety on the unit and medications. Routine safety checks every 15 minutes. Patient stated understanding to tell nurse about any new physical symptoms. Patient understands to tell staff of any needs.     R: No adverse drug reactions noted. Patient verbally contracts for safety. Patient remains safe at this time and will continue to monitor.   Problem: Safety: Goal: Ability to remain free from injury will improve Outcome: Progressing   Problem: Education: Goal: Knowledge of [facility] Education information/materials will improve Outcome: Progressing  Patient oriented to the unit. Patient remains safe and will continue to monitor.   [place] NOVEL CORONAVIRUS (COVID-19) DAILY CHECK-OFF SYMPTOMS - answer yes or no to each - every day NO YES  Have you had a fever in the past 24 hours?  Fever (Temp > 37.80C / 100F) X   Have you had any of these symptoms in the past 24 hours? New Cough  Sore Throat   Shortness of Breath  Difficulty Breathing  Unexplained Body Aches   X   Have you had any one of these symptoms in the past 24 hours not related to allergies?   Runny Nose  Nasal Congestion  Sneezing   X   If you have had runny nose, nasal congestion, sneezing in the past 24 hours, has it worsened?  X   EXPOSURES - check yes or no X   Have you traveled outside the state in the past 14 days?  X   Have you been in contact with someone with a confirmed diagnosis of COVID-19 or PUI in the past 14 days without wearing appropriate PPE?  X   Have you been living in the same home as a person with confirmed diagnosis of COVID-19 or a PUI (household contact)?    X   Have you been diagnosed with COVID-19?    X              What to do next:  Answered NO to all: Answered YES to anything:   Proceed with unit schedule Follow the [facility] Inpatient Flowsheet.

## 2019-02-01 NOTE — Progress Notes (Signed)
Inpatient Diabetes Program Recommendations  AACE/ADA: New Consensus Statement on Inpatient Glycemic Control (2015)  Target Ranges:  Prepandial:   less than 140 mg/dL      Peak postprandial:   less than 180 mg/dL (1-2 hours)      Critically ill patients:  140 - 180 mg/dL   Lab Results  Component Value Date   GLUCAP 396 (H) 02/01/2019   HGBA1C 10.4 (H) 01/28/2019    Review of Glycemic Control  CBGs past 24H: 155-396 mg/dL  Inpatient Diabetes Program Recommendations:     Increase 70/30 to home dose of 35 units bid.  Continue to follow trends.   Thank you. Lorenda Peck, RD, LDN, CDE Inpatient Diabetes Coordinator [phone]

## 2019-02-01 NOTE — Tx Team (Signed)
Interdisciplinary Treatment and Diagnostic Plan Update  02/01/2019 Time of Session: 9:00AM Donald Hartman MRN: [id number]  Principal Diagnosis: <principal problem not specified>  Secondary Diagnoses: Active Problems:   MDD (major depressive disorder), recurrent episode, severe (HCC)   Current Medications:  Current Facility-Administered Medications  Medication Dose Route Frequency Provider Last Rate Last Dose  . acetaminophen (TYLENOL) tablet 650 mg  650 mg Oral Q6H PRN Money, Lowry Ram, FNP      . alum & mag hydroxide-simeth (MAALOX/MYLANTA) 200-200-20 MG/5ML suspension 30 mL  30 mL Oral Q4H PRN Money, Darnelle Maffucci B, FNP      . buPROPion (WELLBUTRIN XL) 24 hr tablet 300 mg  300 mg Oral Daily Sharma Covert, MD   300 mg at 02/01/19 0900  . chlordiazePOXIDE (LIBRIUM) capsule 25 mg  25 mg Oral QID PRN Sharma Covert, MD   25 mg at 01/31/19 1702  . cloNIDine (CATAPRES) tablet 0.1 mg  0.1 mg Oral QID Sharma Covert, MD   0.1 mg at 01/31/19 2120   Followed by  . [START ON 02/03/2019] cloNIDine (CATAPRES) tablet 0.1 mg  0.1 mg Oral BH-qamhs Clary, Cordie Grice, MD       Followed by  . [START ON 02/05/2019] cloNIDine (CATAPRES) tablet 0.1 mg  0.1 mg Oral QAC breakfast Sharma Covert, MD      . dicyclomine (BENTYL) tablet 20 mg  20 mg Oral Q6H PRN Sharma Covert, MD   20 mg at 02/01/19 1153  . folic acid (FOLVITE) tablet 1 mg  1 mg Oral Daily Sharma Covert, MD   1 mg at 02/01/19 0900  . hydrOXYzine (ATARAX/VISTARIL) tablet 25 mg  25 mg Oral Q6H PRN Sharma Covert, MD   25 mg at 02/01/19 1153  . insulin aspart (novoLOG) injection 0-20 Units  0-20 Units Subcutaneous TID WC Sharma Covert, MD   15 Units at 02/01/19 1200  . insulin aspart (novoLOG) injection 0-5 Units  0-5 Units Subcutaneous QHS Sharma Covert, MD      . insulin aspart (novoLOG) injection 0-5 Units  0-5 Units Subcutaneous QHS Mallie Darting Cordie Grice, MD      . insulin aspart protamine- aspart (NOVOLOG MIX  70/30) injection 30 Units  30 Units Subcutaneous BID WC Sharma Covert, MD   30 Units at 02/01/19 0930  . loperamide (IMODIUM) capsule 2-4 mg  2-4 mg Oral PRN Sharma Covert, MD      . magnesium hydroxide (MILK OF MAGNESIA) suspension 30 mL  30 mL Oral Daily PRN Money, Darnelle Maffucci B, FNP      . methocarbamol (ROBAXIN) tablet 500 mg  500 mg Oral Q8H PRN Sharma Covert, MD   500 mg at 02/01/19 1152  . naproxen (NAPROSYN) tablet 500 mg  500 mg Oral BID PRN Sharma Covert, MD   500 mg at 02/01/19 1152  . OLANZapine zydis (ZYPREXA) disintegrating tablet 10 mg  10 mg Oral Q8H PRN Sharma Covert, MD   10 mg at 02/01/19 1152   And  . ziprasidone (GEODON) injection 20 mg  20 mg Intramuscular PRN Sharma Covert, MD      . ondansetron (ZOFRAN-ODT) disintegrating tablet 4 mg  4 mg Oral Q6H PRN Sharma Covert, MD   4 mg at 02/01/19 1152  . [START ON 02/02/2019] pneumococcal 23 valent vaccine (PNU-IMMUNE) injection 0.5 mL  0.5 mL Intramuscular Tomorrow-1000 Sharma Covert, MD      . QUEtiapine (SEROQUEL) tablet 300 mg  300 mg Oral Daily Sharma Covert, MD   300 mg at 02/01/19 0900  . QUEtiapine (SEROQUEL) tablet 400 mg  400 mg Oral QHS Sharma Covert, MD   400 mg at 01/31/19 2119  . sertraline (ZOLOFT) tablet 100 mg  100 mg Oral Daily Sharma Covert, MD   100 mg at 02/01/19 0900  . thiamine (VITAMIN B-1) tablet 100 mg  100 mg Oral Daily Sharma Covert, MD   100 mg at 02/01/19 0900  . traZODone (DESYREL) tablet 100 mg  100 mg Oral QHS PRN Sharma Covert, MD   100 mg at 01/31/19 2122   PTA Medications: Medications Prior to Admission  Medication Sig Dispense Refill Last Dose  . ARIPiprazole (ABILIFY PO) Take 1 tablet by mouth 2 (two) times a day.   Unknown at Unknown time  . buprenorphine-naloxone (SUBOXONE) 8-2 mg SUBL SL tablet Place 1 tablet under the tongue 2 (two) times a day.   Unknown at Unknown time  . buPROPion (WELLBUTRIN XL) 300 MG 24 hr tablet Take 300 mg  by mouth daily.   Unknown at Unknown time  . insulin aspart (NOVOLOG) 100 UNIT/ML injection Inject 2-15 Units into the skin 3 (three) times daily before meals. SLIDING SCALE- 150-2 UNITS. 200-4 UNITS  250-6 UNITS  300-400 8 UNITS . 400 12 UNITS 500-600 15 UNITS.   Unknown at Unknown time  . insulin NPH-regular Human (70-30) 100 UNIT/ML injection Inject 35 Units into the skin 2 (two) times a day.   Unknown at Unknown time  . QUEtiapine (SEROQUEL) 300 MG tablet Take 300 mg by mouth 2 (two) times daily.   Unknown at Unknown time  . sertraline (ZOLOFT) 100 MG tablet Take 200 mg by mouth daily.   Unknown at Unknown time  . traZODone (DESYREL) 100 MG tablet Take 100 mg by mouth at bedtime.   Unknown at Unknown time    Patient Stressors: Educational concerns Financial difficulties Health problems  Patient Strengths: Ability for insight Active sense of humor Average or above average intelligence  Treatment Modalities: Medication Management, Group therapy, Case management,  1 to 1 session with clinician, Psychoeducation, Recreational therapy.   Physician Treatment Plan for Primary Diagnosis: <principal problem not specified> Long Term Goal(s):     Short Term Goals:    Medication Management: Evaluate patient's response, side effects, and tolerance of medication regimen.  Therapeutic Interventions: 1 to 1 sessions, Unit Group sessions and Medication administration.  Evaluation of Outcomes: Not Met  Physician Treatment Plan for Secondary Diagnosis: Active Problems:   MDD (major depressive disorder), recurrent episode, severe ([place])  Long Term Goal(s):     Short Term Goals:       Medication Management: Evaluate patient's response, side effects, and tolerance of medication regimen.  Therapeutic Interventions: 1 to 1 sessions, Unit Group sessions and Medication administration.  Evaluation of Outcomes: Not Met   RN Treatment Plan for Primary Diagnosis: <principal problem not  specified> Long Term Goal(s): Knowledge of disease and therapeutic regimen to maintain health will improve  Short Term Goals: Ability to remain free from injury will improve, Ability to verbalize frustration and anger appropriately will improve, Ability to identify and develop effective coping behaviors will improve and Compliance with prescribed medications will improve  Medication Management: RN will administer medications as ordered by provider, will assess and evaluate patient's response and provide education to patient for prescribed medication. RN will report any adverse and/or side effects to prescribing provider.  Therapeutic Interventions: 1  on 1 counseling sessions, Psychoeducation, Medication administration, Evaluate responses to treatment, Monitor vital signs and CBGs as ordered, Perform/monitor CIWA, COWS, AIMS and Fall Risk screenings as ordered, Perform wound care treatments as ordered.  Evaluation of Outcomes: Not Met   LCSW Treatment Plan for Primary Diagnosis: <principal problem not specified> Long Term Goal(s): Safe transition to appropriate next level of care at discharge, Engage patient in therapeutic group addressing interpersonal concerns.  Short Term Goals: Engage patient in aftercare planning with referrals and resources, Increase social support, Identify triggers associated with mental health/substance abuse issues and Increase skills for wellness and recovery  Therapeutic Interventions: Assess for all discharge needs, 1 to 1 time with Social worker, Explore available resources and support systems, Assess for adequacy in community support network, Educate family and significant other(s) on suicide prevention, Complete Psychosocial Assessment, Interpersonal group therapy.  Evaluation of Outcomes: Not Met   Progress in Treatment: Attending groups: No. Participating in groups: No. Taking medication as prescribed: Yes. Toleration medication: Yes. Family/Significant  other contact made: No, will contact:  supports if consents are granted.\ Patient understands diagnosis: Yes. Discussing patient identified problems/goals with staff: Yes. Medical problems stabilized or resolved: No. Poorly controlled diabetes. Denies suicidal/homicidal ideation: No. Issues/concerns per patient self-inventory: Yes.   New problem(s) identified: Yes, Describe:  homelessness, no income, no local supports, poorly managed diabetes  New Short Term/Long Term Goal(s): detox, medication management for mood stabilization; elimination of SI thoughts; development of comprehensive mental wellness/sobriety plan.  Patient Goals:    Discharge Plan or Barriers: CSW continuing to assess for appropriate referrals.   Reason for Continuation of Hospitalization: Anxiety Depression Medical Issues Withdrawal symptoms  Estimated Length of Stay: 3-5 days  Attendees: Patient: 02/01/2019 12:23 PM  Physician:  02/01/2019 12:23 PM  Nursing:  02/01/2019 12:23 PM  RN Care Manager: 02/01/2019 12:23 PM  Social Worker:  Stephanie Acre, Latanya Presser 02/01/2019 12:23 PM  Recreational Therapist:  02/01/2019 12:23 PM  Other:  02/01/2019 12:23 PM  Other:  02/01/2019 12:23 PM  Other: 02/01/2019 12:23 PM    Scribe for Treatment Team: [name], [place] 02/01/2019 12:23 PM

## 2019-02-02 DIAGNOSIS — F1124 Opioid dependence with opioid-induced mood disorder: Secondary | ICD-10-CM

## 2019-02-02 LAB — GLUCOSE, CAPILLARY
Glucose-Capillary: 456 mg/dL — ABNORMAL HIGH (ref 70–99)
Glucose-Capillary: 423 mg/dL — ABNORMAL HIGH (ref 70–99)
Glucose-Capillary: 333 mg/dL — ABNORMAL HIGH (ref 70–99)
Glucose-Capillary: 202 mg/dL — ABNORMAL HIGH (ref 70–99)
Glucose-Capillary: 137 mg/dL — ABNORMAL HIGH (ref 70–99)
Glucose-Capillary: 398 mg/dL — ABNORMAL HIGH (ref 70–99)
Glucose-Capillary: 376 mg/dL — ABNORMAL HIGH (ref 70–99)

## 2019-02-02 MED ORDER — INSULIN GLARGINE 100 UNIT/ML ~~LOC~~ SOLN
40.0000 [IU] | Freq: Every day | SUBCUTANEOUS | Status: DC
  Administered 2019-02-02 – 2019-02-03 (×2): 40 [IU] via SUBCUTANEOUS

## 2019-02-02 MED ORDER — INSULIN ASPART 100 UNIT/ML ~~LOC~~ SOLN
5.0000 [IU] | Freq: Three times a day (TID) | SUBCUTANEOUS | Status: DC
  Administered 2019-02-02 – 2019-02-03 (×3): 5 [IU] via SUBCUTANEOUS

## 2019-02-02 NOTE — Plan of Care (Signed)
Progress note  D: pt found in the dayroom; compliant with medication administration. Pt has complaints of dizziness and unsteadiness while walking. Pt denies any physical pain. Pt has complaints of anxiety around not being able to get back to [facility]. Pt states he has had "bad" thoughts surrounding this, but contracts for safety. Pt denies si/hi/ah/vh and verbally agrees to approach staff if these become apparent or before harming himself/others while at [place]. A: pt provided support and encouragement. Pt given medication per protocol and standing orders. Q76m safety checks implemented and continued.  R: pt safe on the unit. Will continue to monitor.  Pt progressing in the following metrics  Problem: Education: Goal: Ability to make informed decisions regarding treatment will improve Outcome: Progressing   Problem: Coping: Goal: Coping ability will improve Outcome: Progressing   Problem: Health Behavior/Discharge Planning: Goal: Identification of resources available to assist in meeting health care needs will improve Outcome: Progressing   Problem: Medication: Goal: Compliance with prescribed medication regimen will improve Outcome: Progressing

## 2019-02-02 NOTE — Progress Notes (Signed)
Patient did not attend wrap up group. 

## 2019-02-02 NOTE — Progress Notes (Signed)
Received phone consult from Dr. Parke Poisson for the management of hyperglycemia.  Patient has history of type 1 diabetes.  Takes insulin 70/30 35 units twice daily at home. Blood sugar elevated in the range of 400s this morning. I will switch the insulin to Lantus 40 units daily along with NovoLog with meals.  Continue sliding scale insulin as before. Diabetic coordinator already following. Please recall [place] if necessary.

## 2019-02-02 NOTE — [facility] Suicide Risk Assessment (Signed)
[place] INPATIENT:  Family/Significant Other Suicide Prevention Education  Suicide Prevention Education:  Education Completed; patient provided information for "friend, Rosario Adie" /// "Aunt," Benny Lennert  [phone] has been identified by the patient as the family member/significant other with whom the patient will be residing, and identified as the person(s) who will aid the patient in the event of a mental health crisis (suicidal ideations/suicide attempt).  With written consent from the patient, the family member/significant other has been provided the following suicide prevention education, prior to the and/or following the discharge of the patient.  The suicide prevention education provided includes the following:  Suicide risk factors  Suicide prevention and interventions  National Suicide Hotline telephone number  [facility] assessment telephone number  [facility] Emergency Assistance [place] and/or Residential Mobile Crisis Unit telephone number  Request made of family/significant other to:  Remove weapons (e.g., guns, rifles, knives), all items previously/currently identified as safety concern.    Remove drugs/medications (over-the-counter, prescriptions, illicit drugs), all items previously/currently identified as a safety concern.  The family member/significant other verbalizes understanding of the suicide prevention education information provided.  The family member/significant other agrees to remove the items of safety concern listed above.    Patient provided the contact information for a male friend "Rosario Adie" or "Chicky." When CSW called this person, the caller identified herself as the patient's aunt "Benny Lennert." The caller reports that Thadd has lived in [place] for a while and is trying to get back, she says he can stay with her. When CSW asked for a home address, the caller provided the address for a call rental agency ([address] [place] [address]).   [name] 02/02/2019, 2:17 PM
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
## 2019-02-02 NOTE — Progress Notes (Signed)
CSW following up on patient request for transportation assistance. CSW shared that the transportation request has been staffed with leadership, but shared that the inconsistencies are a barrier to the request being approved. CSW inquired if there are any other social contacts to verify information to further the transportation request.  Patient acknowledged that he told his friend to lie and say she was his aunt. Patient became angry, and told CSW to go fuck herself.  Stephanie Acre, LCSW-A Clinical Social Worker

## 2019-02-02 NOTE — Progress Notes (Signed)
Patient ID: Donald Hartman, male   DOB: 08-03-1986, 33 y.o.   MRN: [id number] D: Patient observed watching TV and interacting well with peers on approach. Pt presented with depressed mood and flat affect. Pt appeared irritable about the changes to his blood glucose regime. Denies  SI/HI/AVH and pain.No behavioral issues noted.  A: Support and encouragement offered as needed to express needs. Medications administered as prescribed.  R: Patient is safe and cooperative on unit. Will continue to monitor  for safety and stability.
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
## 2019-02-02 NOTE — [facility] Group Notes (Signed)
[facility] Group Therapy 02/02/2019 1:54 PM  Type of Therapy: Mental Health Association Presentation  Participation Level: Active  Participation Quality: Attentive  Affect: Appropriate  Cognitive: Oriented  Insight: Developing/Improving  Engagement in Therapy: Engaged  Modes of Intervention: Discussion, Education and Socialization   Summary of Progress/Problems: Mental Health Association (MHA) Speaker came to talk about his personal journey with mental health. The pt processed ways by which to relate to the speaker. MHA speaker provided handouts and educational information pertaining to groups and services offered by the MHA. Pt was engaged in speaker's presentation and was receptive to resources provided.   Macari Zalesky, MSW, LCSWA 02/02/2019 1:54 PM 

## 2019-02-02 NOTE — [facility] Suicide Risk Assessment (Signed)
[place] INPATIENT:  Family/Significant Other Suicide Prevention Education  Suicide Prevention Education:  Contact Attempts: mother, Donald Hartman has been identified by the patient as the family member/significant other with whom the patient will be residing, and identified as the person(s) who will aid the patient in the event of a mental health crisis.  With written consent from the patient, two attempts were made to provide suicide prevention education, prior to and/or following the patient's discharge.  We were unsuccessful in providing suicide prevention education.  A suicide education pamphlet was given to the patient to share with family/significant others.    Mother needs a Spanish interpreter, CSW attempted to reach mother with the assistance of [place] [phone]). [phone] (on ROI) was called and the caller stated CSW had the wrong number. A HIPAA compliant VM was left in Spanish for [phone] (in chart).  Date and time of first attempt:02/02/2019 at 10:50am   [name] 02/02/2019, 10:53 AM

## 2019-02-03 DIAGNOSIS — F1124 Opioid dependence with opioid-induced mood disorder: Secondary | ICD-10-CM | POA: Diagnosis not present

## 2019-02-03 LAB — LIPID PANEL
Cholesterol: 96 mg/dL (ref 0–200)
HDL: 47 mg/dL (ref >40)
LDL Cholesterol: 26 mg/dL (ref 0–99)
Total CHOL/HDL Ratio: 2 RATIO
Triglycerides: 113 mg/dL (ref <150)
VLDL: 23 mg/dL (ref 0–40)

## 2019-02-03 LAB — TSH: TSH: 2.617 u[IU]/mL (ref 0.350–4.500)

## 2019-02-03 LAB — GLUCOSE, CAPILLARY
Glucose-Capillary: 444 mg/dL — ABNORMAL HIGH (ref 70–99)
Glucose-Capillary: 236 mg/dL — ABNORMAL HIGH (ref 70–99)
Glucose-Capillary: 321 mg/dL — ABNORMAL HIGH (ref 70–99)
Glucose-Capillary: 327 mg/dL — ABNORMAL HIGH (ref 70–99)
Glucose-Capillary: 406 mg/dL — ABNORMAL HIGH (ref 70–99)

## 2019-02-03 MED ORDER — INSULIN ASPART 100 UNIT/ML ~~LOC~~ SOLN
10.0000 [IU] | Freq: Three times a day (TID) | SUBCUTANEOUS | Status: DC
  Administered 2019-02-03 – 2019-02-04 (×3): 10 [IU] via SUBCUTANEOUS

## 2019-02-03 MED ORDER — INSULIN GLARGINE 100 UNIT/ML ~~LOC~~ SOLN
50.0000 [IU] | Freq: Every day | SUBCUTANEOUS | Status: DC
  Administered 2019-02-04: 50 [IU] via SUBCUTANEOUS

## 2019-02-03 MED ORDER — INSULIN ASPART 100 UNIT/ML ~~LOC~~ SOLN
2.0000 [IU] | Freq: Once | SUBCUTANEOUS | Status: AC
  Administered 2019-02-03: 2 [IU] via SUBCUTANEOUS

## 2019-02-03 NOTE — Plan of Care (Addendum)
Patient was anxious and somewhat irritable upon approach this morning. Patient asked for his prn Librium for detox which was administered. Patient was satisfied after speaking with the doctor and learning we will be sending him back to [facility] tomorrow as long as his sugars are Under control. Denies SI HI AVH. Denies physical pain. Patient is compliant with medications. Denies any side effects. Safety is maintained with 15 minute checks as well as environmental checks. Will continue to monitor and assess throughout the shift.  Problem: Education: Goal: Ability to make informed decisions regarding treatment will improve Outcome: Progressing   Problem: Coping: Goal: Coping ability will improve Outcome: Progressing   Problem: Health Behavior/Discharge Planning: Goal: Identification of resources available to assist in meeting health care needs will improve Outcome: Progressing   Problem: Medication: Goal: Compliance with prescribed medication regimen will improve Outcome: Progressing

## 2019-02-03 NOTE — Progress Notes (Addendum)
[facility] MD Progress Note  02/03/2019 9:36 AM Donald Hartman  MRN:  [id number] Subjective:  Patient reports feeling anxious, mainly regarding disposition planning concerns.  He states " I do not know this city or area, I have no one here , I do not want to end up being homeless here ". He is focused on returning to [place]. States in [place] he has an aunt , whom he does not want to live with because she uses drugs , but with whom he can live if he has no other options. He also states he has a friend who has agreed to let him live with her for a period of time.  Objective : I have discussed case with treatment team and have met with patient . 33 year old male, originally from [place]. Reports he was travelling by bus from [place] back to [place], where he resides , but became stranded in [place] after losing his bus ticket and back pack where he had his medications and his cell phone .He was initially in [place] area and had a recent  psychiatric admission at [facility], was discharged about a week ago.States that someone he met told him he could spend a few days in [place] with him, which led patient to travel from [place] to [place], but states " when I got here the guy never answered his phone, so I am all alone in this city".  Patient reports history of depression, intermittent auditory hallucinations, substance abuse , and states he had recently relapsed on heroin after losing prescribed Suboxone . He also reports recent increased alcohol consumption after being discharged from [facility]. Medical history is remarkable for DM I . States he had not been taking insulin over the last few days prior to admission.  As above, patient presents focused on returning to [place]. Currently denies suicidal ideations. He has a history of self cutting, and states that yesterday he had urges to cut due to frustration about his current situation and not having any money to return to [place], but denies any actual plan or intent. As  above, he currently presents future oriented and focused on disposition planning.  As reviewed with CSW, patient provided a contact number in [place] area whom he identified as an aunt.  Report from staff is that this person stated she was a friend but not aunt and provided an address which when reviewed was a car rental company.  I met patient along with CSW to clarify above.  Patient acknowledges that this person is a friend and not his aunt.  States that he is hoping not to return to [place] because "she uses drugs".  States that this friend works at said Hospital doctor and that is "where she would pick me up". Today denies suicidal ideations.  He also denies hallucinations and does not appear internally preoccupied.  He appears anxious particularly when discussing disposition planning options and concerns.  He is noted to be more visible in milieu and becoming more interactive with peers.  No disruptive or agitated behaviors on unit.  He denies medication side effects.  Diabetes remains poorly controlled.  Most recent CBGs are 376 and 444.  I have reviewed with patient the importance of following a diabetic diet and making low carb/ appropriate food option decisions during meals.  I have consulted with hospitalist service for diabetic management and adjustment as needed.  Labs reviewed-TSH 2.617, lipid panel unremarkable.     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  Opiate dependence.  Diagnosis: Active Problems:   MDD (major depressive disorder), recurrent episode, severe ([place])  Total Time spent with patient: 20 minutes  Past Psychiatric History:   Past Medical History:  Past Medical History:  Diagnosis Date  . Diabetes mellitus without complication ([place])    History reviewed. No pertinent surgical history. Family History: History reviewed. No pertinent family history. Family Psychiatric  History:  Social History:  Social History   Substance and Sexual Activity   Alcohol Use None     Social History   Substance and Sexual Activity  Drug Use Not on file    Social History   Socioeconomic History  . Marital status: Single    Spouse name: Not on file  . Number of children: Not on file  . Years of education: Not on file  . Highest education level: Not on file  Occupational History  . Not on file  Social Needs  . Financial resource strain: Very hard  . Food insecurity    Worry: Often true    Inability: Often true  . Transportation needs    Medical: Yes    Non-medical: Yes  Tobacco Use  . Smoking status: Current Every Day Smoker  . Smokeless tobacco: Never Used  Substance and Sexual Activity  . Alcohol use: Not on file  . Drug use: Not on file  . Sexual activity: Yes    Birth control/protection: None  Lifestyle  . Physical activity    Days per week: 0 days    Minutes per session: 0 min  . Stress: Very much  Relationships  . Social Herbalist on phone: Never    Gets together: Never    Attends religious service: Never    Active member of club or organization: No    Attends meetings of clubs or organizations: Not on file    Relationship status: Never married  Other Topics Concern  . Not on file  Social History Narrative  . Not on file   Additional Social History:    History of alcohol / drug use?: Yes Negative Consequences of Use: Financial, Legal Withdrawal Symptoms: Agitation   Sleep: Improving  Appetite:  Gradually improving  Current Medications: Current Facility-Administered Medications  Medication Dose Route Frequency Provider Last Rate Last Dose  . acetaminophen (TYLENOL) tablet 650 mg  650 mg Oral Q6H PRN Money, Lowry Ram, FNP      . alum & mag hydroxide-simeth (MAALOX/MYLANTA) 200-200-20 MG/5ML suspension 30 mL  30 mL Oral Q4H PRN Money, Darnelle Maffucci B, FNP   30 mL at 02/01/19 1723  . buPROPion (WELLBUTRIN XL) 24 hr tablet 300 mg  300 mg Oral Daily Sharma Covert, MD   300 mg at 02/03/19 2725  .  chlordiazePOXIDE (LIBRIUM) capsule 25 mg  25 mg Oral QID PRN Sharma Covert, MD   25 mg at 02/03/19 0921  . cloNIDine (CATAPRES) tablet 0.1 mg  0.1 mg Oral BH-qamhs Sharma Covert, MD   0.1 mg at 02/03/19 3664   Followed by  . [START ON 02/05/2019] cloNIDine (CATAPRES) tablet 0.1 mg  0.1 mg Oral QAC breakfast Sharma Covert, MD      . dicyclomine (BENTYL) tablet 20 mg  20 mg Oral Q6H PRN Sharma Covert, MD   20 mg at 02/01/19 1153  . folic acid (FOLVITE) tablet 1 mg  1 mg Oral Daily Sharma Covert, MD   1 mg at 02/03/19 4034  . hydrOXYzine (ATARAX/VISTARIL) tablet 25 mg  25 mg Oral Q6H PRN Sharma Covert, MD   25 mg at 02/02/19 1619  . insulin aspart (novoLOG) injection 0-20 Units  0-20 Units Subcutaneous TID WC Sharma Covert, MD   20 Units at 02/03/19 [phone]  . insulin aspart (novoLOG) injection 0-5 Units  0-5 Units Subcutaneous QHS Sharma Covert, MD   5 Units at 02/02/19 2205  . insulin aspart (novoLOG) injection 5 Units  5 Units Subcutaneous TID WC Shelly Coss, MD   5 Units at 02/03/19 [phone]  . insulin glargine (LANTUS) injection 40 Units  40 Units Subcutaneous Daily Shelly Coss, MD   40 Units at 02/03/19 5997  . loperamide (IMODIUM) capsule 2-4 mg  2-4 mg Oral PRN Sharma Covert, MD      . magnesium hydroxide (MILK OF MAGNESIA) suspension 30 mL  30 mL Oral Daily PRN Money, Darnelle Maffucci B, FNP      . methocarbamol (ROBAXIN) tablet 500 mg  500 mg Oral Q8H PRN Sharma Covert, MD   500 mg at 02/01/19 1152  . naproxen (NAPROSYN) tablet 500 mg  500 mg Oral BID PRN Sharma Covert, MD   500 mg at 02/01/19 1152  . OLANZapine zydis (ZYPREXA) disintegrating tablet 10 mg  10 mg Oral Q8H PRN Sharma Covert, MD   10 mg at 02/02/19 1619   And  . ziprasidone (GEODON) injection 20 mg  20 mg Intramuscular PRN Sharma Covert, MD      . ondansetron (ZOFRAN-ODT) disintegrating tablet 4 mg  4 mg Oral Q6H PRN Sharma Covert, MD   4 mg at 02/01/19 1152  .  pneumococcal 23 valent vaccine (PNU-IMMUNE) injection 0.5 mL  0.5 mL Intramuscular Tomorrow-1000 Sharma Covert, MD      . QUEtiapine (SEROQUEL) tablet 300 mg  300 mg Oral Daily Sharma Covert, MD   300 mg at 02/03/19 7414  . QUEtiapine (SEROQUEL) tablet 400 mg  400 mg Oral QHS Sharma Covert, MD   400 mg at 02/01/19 2213  . sertraline (ZOLOFT) tablet 100 mg  100 mg Oral Daily Sharma Covert, MD   100 mg at 02/03/19 2395  . thiamine (VITAMIN B-1) tablet 100 mg  100 mg Oral Daily Sharma Covert, MD   100 mg at 02/03/19 3202  . traZODone (DESYREL) tablet 100 mg  100 mg Oral QHS PRN Sharma Covert, MD   100 mg at 02/01/19 2213    Lab Results:  Results for orders placed or performed during the hospital encounter of 01/31/19 (from the past 48 hour(s))  Glucose, capillary     Status: Abnormal   Collection Time: 02/01/19 11:55 AM  Result Value Ref Range   Glucose-Capillary 341 (H) 70 - 99 mg/dL   Comment 1 Notify RN    Comment 2 Document in Chart   Glucose, capillary     Status: Abnormal   Collection Time: 02/01/19  2:43 PM  Result Value Ref Range   Glucose-Capillary 167 (H) 70 - 99 mg/dL  Glucose, capillary     Status: Abnormal   Collection Time: 02/01/19  4:44 PM  Result Value Ref Range   Glucose-Capillary 338 (H) 70 - 99 mg/dL  Comprehensive metabolic panel     Status: Abnormal   Collection Time: 02/01/19  6:33 PM  Result Value Ref Range   Sodium 138 135 - 145 mmol/L   Potassium 4.0 3.5 - 5.1 mmol/L   Chloride 101 98 - 111 mmol/L   CO2 29  22 - 32 mmol/L   Glucose, Bld 301 (H) 70 - 99 mg/dL   BUN 14 6 - 20 mg/dL   Creatinine, Ser 1.10 0.61 - 1.24 mg/dL   Calcium 8.9 8.9 - 10.3 mg/dL   Total Protein 6.4 (L) 6.5 - 8.1 g/dL   Albumin 3.5 3.5 - 5.0 g/dL   AST 86 (H) 15 - 41 U/L   ALT 104 (H) 0 - 44 U/L   Alkaline Phosphatase 60 38 - 126 U/L   Total Bilirubin 0.2 (L) 0.3 - 1.2 mg/dL   GFR calc non Af Amer >60 >60 mL/min   GFR calc Af Amer >60 >60 mL/min    Anion gap 8 5 - 15    Comment: Performed at [facility], [place] [address] [place], [place] [address]  Glucose, capillary     Status: Abnormal   Collection Time: 02/01/19  8:19 PM  Result Value Ref Range   Glucose-Capillary 173 (H) 70 - 99 mg/dL   Comment 1 Notify RN   Glucose, capillary     Status: Abnormal   Collection Time: 02/02/19  5:53 AM  Result Value Ref Range   Glucose-Capillary 456 (H) 70 - 99 mg/dL  Glucose, capillary     Status: Abnormal   Collection Time: 02/02/19  8:27 AM  Result Value Ref Range   Glucose-Capillary 423 (H) 70 - 99 mg/dL   Comment 1 Notify RN    Comment 2 Document in Chart   Glucose, capillary     Status: Abnormal   Collection Time: 02/02/19  9:32 AM  Result Value Ref Range   Glucose-Capillary 333 (H) 70 - 99 mg/dL   Comment 1 Notify RN    Comment 2 Document in Chart   Glucose, capillary     Status: Abnormal   Collection Time: 02/02/19 11:48 AM  Result Value Ref Range   Glucose-Capillary 202 (H) 70 - 99 mg/dL   Comment 1 Notify RN    Comment 2 Document in Chart   Glucose, capillary     Status: Abnormal   Collection Time: 02/02/19  2:45 PM  Result Value Ref Range   Glucose-Capillary 137 (H) 70 - 99 mg/dL  Glucose, capillary     Status: Abnormal   Collection Time: 02/02/19  4:43 PM  Result Value Ref Range   Glucose-Capillary 398 (H) 70 - 99 mg/dL   Comment 1 Notify RN    Comment 2 Document in Chart   Glucose, capillary     Status: Abnormal   Collection Time: 02/02/19  7:57 PM  Result Value Ref Range   Glucose-Capillary 376 (H) 70 - 99 mg/dL   Comment 1 Notify RN   Glucose, capillary     Status: Abnormal   Collection Time: 02/03/19  5:54 AM  Result Value Ref Range   Glucose-Capillary 444 (H) 70 - 99 mg/dL   Comment 1 Notify RN   TSH     Status: None   Collection Time: 02/03/19  7:05 AM  Result Value Ref Range   TSH 2.617 0.350 - 4.500 uIU/mL    Comment: Performed by a 3rd Generation assay with a functional sensitivity  of <=0.01 uIU/mL. Performed at [facility], [place] [address] [place], [place] [address]   Lipid panel     Status: None   Collection Time: 02/03/19  7:05 AM  Result Value Ref Range   Cholesterol 96 0 - 200 mg/dL   Triglycerides 113 <150 mg/dL   HDL 47 >40  mg/dL   Total CHOL/HDL Ratio 2.0 RATIO   VLDL 23 0 - 40 mg/dL   LDL Cholesterol 26 0 - 99 mg/dL    Comment:        Total Cholesterol/HDL:CHD Risk Coronary Heart Disease Risk Table                     Men   Women  1/2 Average Risk   3.4   3.3  Average Risk       5.0   4.4  2 X Average Risk   9.6   7.1  3 X Average Risk  23.4   11.0        Use the calculated Patient Ratio above and the CHD Risk Table to determine the patient's CHD Risk.        ATP III CLASSIFICATION (LDL):  <100     mg/dL   Optimal  100-129  mg/dL   Near or Above                    Optimal  130-159  mg/dL   Borderline  160-189  mg/dL   High  >190     mg/dL   Very High Performed at [place] [address] [place], [place] [address]     Blood Alcohol level:  Lab Results  Component Value Date   ETH <10 92/06/9416    Metabolic Disorder Labs: Lab Results  Component Value Date   HGBA1C 10.4 (H) 01/28/2019   MPG 251.78 01/28/2019   No results found for: PROLACTIN Lab Results  Component Value Date   CHOL 96 02/03/2019   TRIG 113 02/03/2019   HDL 47 02/03/2019   CHOLHDL 2.0 02/03/2019   VLDL 23 02/03/2019   LDLCALC 26 02/03/2019    Physical Findings: AIMS: Facial and Oral Movements Muscles of Facial Expression: None, normal Lips and Perioral Area: None, normal Jaw: None, normal Tongue: None, normal,Extremity Movements Upper (arms, wrists, hands, fingers): None, normal Lower (legs, knees, ankles, toes): None, normal, Trunk Movements Neck, shoulders, hips: None, normal, Overall Severity Severity of abnormal movements (highest score from questions above): None, normal Incapacitation due to abnormal  movements: None, normal Patient's awareness of abnormal movements (rate only patient's report): No Awareness, Dental Status Current problems with teeth and/or dentures?: No Does patient usually wear dentures?: No  CIWA:  CIWA-Ar Total: 11 COWS:  COWS Total Score: 1  Musculoskeletal: Strength & Muscle Tone: within normal limits-appears calm, in no acute distress, no agitation or restlessness Gait & Station: normal Patient leans: N/A  Psychiatric Specialty Exam: Physical Exam  ROS currently denies chest pain or shortness of breath, no cough, no vomiting, no fever  Blood pressure 132/74, pulse (!) 161, temperature (!) 97.5 F (36.4 C), temperature source Oral, resp. rate 16, height _0  (1.753 m), weight 78.9 kg, SpO2 100 %.Body mass index is 25.7 kg/m.  General Appearance: Fairly Groomed  Eye Contact:  Good  Speech:  Normal Rate  Volume:  Normal  Mood:  Partially improved mood, still anxious  Affect:  Congruent and Today smiles briefly/appropriately at times during session  Thought Process:  Linear and Descriptions of Associations: Intact  Orientation:  Other:  Fully alert and attentive, oriented to month, year but not to day of the week.  Oriented to [place], [place].  Thought Content:  No hallucinations, no delusions  Suicidal Thoughts:  No currently denies suicidal ideations or self-injurious ideations, denies homicidal ideations, contracts for  safety on unit  Homicidal Thoughts:  No  Memory:  Sent and remote grossly intact  Judgement:  Fair/improving  Insight:  Fair  Psychomotor Activity:  Normal-no psychomotor agitation or restlessness  Concentration:  Concentration: Good and Attention Span: Good  Recall:  Good  Fund of Knowledge:  Good  Language:  Good  Akathisia:  Negative  Handed:  Right  AIMS (if indicated):     Assets:  Desire for Improvement Resilience  ADL's:  Intact  Cognition:  WNL  Sleep:  Number of Hours: 6.75   Assessment: 33 year old male,  originally from [place]. Reports he was travelling by bus from [place] to [place], where he resides, but became stranded in [place] after losing his bus ticket and back pack where he had his medications and his cell phone . Recent psychiatric admission at [facility], was discharged about a week ago. Patient reports history of depression, intermittent auditory hallucinations, substance abuse , and states he had recently relapsed on heroin after losing prescribed Suboxone . He also reports recent increased alcohol consumption after being discharged from [facility]. Medical history is remarkable for DM I . States he had not been taking insulin over the last few days prior to admission.  Patient presents with some improvement.  Today is alert, attentive, future oriented and focused on disposition planning, does not appear to be in any acute distress or discomfort.  Currently denies suicidal ideations and presents more future oriented. Does not currently endorse medication side effects.  At this time is not presenting with significant opiate withdrawal symptoms Essentially, he reports that he resides in [place].  He was returning from [place] to [place] and became stranded in [place] after losing his backpack, which she states included medications and other belongings.  He had been living with an aunt but states that he does not want to return there because she was actively using substances.  States that he wants to go live with a friend who has accepted to have him stay there for period of time. He reports he has a primary care doctor in the [place] area, although cannot remember address, but states "I know how to get there".  He also states he follows up at the behavioral services associated with [facility] in [place].   Treatment Plan Summary: Daily contact with patient to assess and evaluate symptoms and progress in treatment, Medication management, Plan inpatient treatment  and medications as below   Treatment plan reviewed as below today 6/24 Inpatient treatment- encourage group and milieu participation to work on coping skills and symptom reduction Encourage efforts to work on sobriety and relapse Contractor providing support  for diabetic management . Continue clonidine detox protocol to manage opiate withdrawal symptoms Continue Librium PRN for alcohol withdrawal as per CIWA scores if needed Continue Wellbutrin XL 300 mg daily depression Continue Zoloft 100 mg daily for depression and anxiety Continue Seroquel at 300 mg twice daily for mood disorder Continue Trazodone 100 mg nightly PRN for insomnia as needed Treatment team working on disposition planning options Jenne Campus, MD 02/03/2019, 9:36 AMPatient ID: Donald Hartman, male   DOB: May 11, 1986, 33 y.o.   MRN: [id number]

## 2019-02-03 NOTE — Progress Notes (Signed)
Patient ID: Donald Hartman, male   DOB: 27-Sep-1985, 33 y.o.   MRN: [id number] CBG this morning was 444. NP notified. New order of 2 unit novolog to be added to existing order of 20unit sliding scale and 5unit standing with meal. 27unit novolog administered. Verified with Raquel Sarna, RN

## 2019-02-03 NOTE — Tx Team (Signed)
Interdisciplinary Treatment and Diagnostic Plan Update  02/03/2019 Time of Session: 9:00AM Barrett HenleDaniel Johns MRN: [id number]  Principal Diagnosis: <principal problem not specified>  Secondary Diagnoses: Active Problems:   MDD (major depressive disorder), recurrent episode, severe (HCC)   Current Medications:  Current Facility-Administered Medications  Medication Dose Route Frequency Provider Last Rate Last Dose  . acetaminophen (TYLENOL) tablet 650 mg  650 mg Oral Q6H PRN Money, Gerlene Burdockravis B, FNP      . alum & mag hydroxide-simeth (MAALOX/MYLANTA) 200-200-20 MG/5ML suspension 30 mL  30 mL Oral Q4H PRN Money, Feliz Beamravis B, FNP   30 mL at 02/01/19 1723  . buPROPion (WELLBUTRIN XL) 24 hr tablet 300 mg  300 mg Oral Daily Antonieta Pertlary, Greg Lawson, MD   300 mg at 02/03/19 [id number]  . chlordiazePOXIDE (LIBRIUM) capsule 25 mg  25 mg Oral QID PRN Antonieta Pertlary, Greg Lawson, MD   25 mg at 02/03/19 0921  . cloNIDine (CATAPRES) tablet 0.1 mg  0.1 mg Oral BH-qamhs Antonieta Pertlary, Greg Lawson, MD   0.1 mg at 02/03/19 [id number]   Followed by  . [START ON 02/05/2019] cloNIDine (CATAPRES) tablet 0.1 mg  0.1 mg Oral QAC breakfast Antonieta Pertlary, Greg Lawson, MD      . dicyclomine (BENTYL) tablet 20 mg  20 mg Oral Q6H PRN Antonieta Pertlary, Greg Lawson, MD   20 mg at 02/01/19 1153  . folic acid (FOLVITE) tablet 1 mg  1 mg Oral Daily Antonieta Pertlary, Greg Lawson, MD   1 mg at 02/03/19 [id number]  . hydrOXYzine (ATARAX/VISTARIL) tablet 25 mg  25 mg Oral Q6H PRN Antonieta Pertlary, Greg Lawson, MD   25 mg at 02/02/19 1619  . insulin aspart (novoLOG) injection 0-20 Units  0-20 Units Subcutaneous TID WC Antonieta Pertlary, Greg Lawson, MD   20 Units at 02/03/19 [id number]  . insulin aspart (novoLOG) injection 0-5 Units  0-5 Units Subcutaneous QHS Antonieta Pertlary, Greg Lawson, MD   5 Units at 02/02/19 2205  . insulin aspart (novoLOG) injection 10 Units  10 Units Subcutaneous TID WC Hughie ClossPahwani, Ravi, MD      . Melene Muller[START ON 02/04/2019] insulin glargine (LANTUS) injection 50 Units  50 Units Subcutaneous Daily Pahwani, Ravi, MD      .  loperamide (IMODIUM) capsule 2-4 mg  2-4 mg Oral PRN Antonieta Pertlary, Greg Lawson, MD      . magnesium hydroxide (MILK OF MAGNESIA) suspension 30 mL  30 mL Oral Daily PRN Money, Feliz Beamravis B, FNP      . methocarbamol (ROBAXIN) tablet 500 mg  500 mg Oral Q8H PRN Antonieta Pertlary, Greg Lawson, MD   500 mg at 02/01/19 1152  . naproxen (NAPROSYN) tablet 500 mg  500 mg Oral BID PRN Antonieta Pertlary, Greg Lawson, MD   500 mg at 02/01/19 1152  . OLANZapine zydis (ZYPREXA) disintegrating tablet 10 mg  10 mg Oral Q8H PRN Antonieta Pertlary, Greg Lawson, MD   10 mg at 02/02/19 1619   And  . ziprasidone (GEODON) injection 20 mg  20 mg Intramuscular PRN Antonieta Pertlary, Greg Lawson, MD      . ondansetron (ZOFRAN-ODT) disintegrating tablet 4 mg  4 mg Oral Q6H PRN Antonieta Pertlary, Greg Lawson, MD   4 mg at 02/01/19 1152  . pneumococcal 23 valent vaccine (PNU-IMMUNE) injection 0.5 mL  0.5 mL Intramuscular Tomorrow-1000 Antonieta Pertlary, Greg Lawson, MD      . QUEtiapine (SEROQUEL) tablet 300 mg  300 mg Oral Daily Antonieta Pertlary, Greg Lawson, MD   300 mg at 02/03/19 [id number]  . QUEtiapine (SEROQUEL) tablet 400 mg  400 mg Oral QHS Antonieta Pertlary, Greg Lawson,  MD   400 mg at 02/01/19 2213  . sertraline (ZOLOFT) tablet 100 mg  100 mg Oral Daily Sharma Covert, MD   100 mg at 02/03/19 1224  . thiamine (VITAMIN B-1) tablet 100 mg  100 mg Oral Daily Sharma Covert, MD   100 mg at 02/03/19 8250  . traZODone (DESYREL) tablet 100 mg  100 mg Oral QHS PRN Sharma Covert, MD   100 mg at 02/01/19 2213   PTA Medications: Medications Prior to Admission  Medication Sig Dispense Refill Last Dose  . ARIPiprazole (ABILIFY PO) Take 1 tablet by mouth 2 (two) times a day.   Unknown at Unknown time  . buprenorphine-naloxone (SUBOXONE) 8-2 mg SUBL SL tablet Place 1 tablet under the tongue 2 (two) times a day.   Unknown at Unknown time  . buPROPion (WELLBUTRIN XL) 300 MG 24 hr tablet Take 300 mg by mouth daily.   Unknown at Unknown time  . insulin aspart (NOVOLOG) 100 UNIT/ML injection Inject 2-15 Units into the skin 3  (three) times daily before meals. SLIDING SCALE- 150-2 UNITS. 200-4 UNITS  250-6 UNITS  300-400 8 UNITS . 400 12 UNITS 500-600 15 UNITS.   Unknown at Unknown time  . insulin NPH-regular Human (70-30) 100 UNIT/ML injection Inject 35 Units into the skin 2 (two) times a day.   Unknown at Unknown time  . QUEtiapine (SEROQUEL) 300 MG tablet Take 300 mg by mouth 2 (two) times daily.   Unknown at Unknown time  . sertraline (ZOLOFT) 100 MG tablet Take 200 mg by mouth daily.   Unknown at Unknown time  . traZODone (DESYREL) 100 MG tablet Take 100 mg by mouth at bedtime.   Unknown at Unknown time    Patient Stressors: Educational concerns Financial difficulties Health problems  Patient Strengths: Ability for insight Active sense of humor Average or above average intelligence  Treatment Modalities: Medication Management, Group therapy, Case management,  1 to 1 session with clinician, Psychoeducation, Recreational therapy.   Physician Treatment Plan for Primary Diagnosis: <principal problem not specified> Long Term Goal(s): Improvement in symptoms so as ready for discharge Improvement in symptoms so as ready for discharge   Short Term Goals: Ability to identify changes in lifestyle to reduce recurrence of condition will improve Ability to verbalize feelings will improve Ability to disclose and discuss suicidal ideas Ability to demonstrate self-control will improve Ability to identify and develop effective coping behaviors will improve Ability to maintain clinical measurements within normal limits will improve Compliance with prescribed medications will improve Ability to identify triggers associated with substance abuse/mental health issues will improve Ability to identify changes in lifestyle to reduce recurrence of condition will improve Ability to verbalize feelings will improve Ability to disclose and discuss suicidal ideas Ability to demonstrate self-control will improve Ability to  identify and develop effective coping behaviors will improve Ability to maintain clinical measurements within normal limits will improve Compliance with prescribed medications will improve Ability to identify triggers associated with substance abuse/mental health issues will improve  Medication Management: Evaluate patient's response, side effects, and tolerance of medication regimen.  Therapeutic Interventions: 1 to 1 sessions, Unit Group sessions and Medication administration.  Evaluation of Outcomes: Progressing  Physician Treatment Plan for Secondary Diagnosis: Active Problems:   MDD (major depressive disorder), recurrent episode, severe ([place])  Long Term Goal(s): Improvement in symptoms so as ready for discharge Improvement in symptoms so as ready for discharge   Short Term Goals: Ability to identify changes in lifestyle  to reduce recurrence of condition will improve Ability to verbalize feelings will improve Ability to disclose and discuss suicidal ideas Ability to demonstrate self-control will improve Ability to identify and develop effective coping behaviors will improve Ability to maintain clinical measurements within normal limits will improve Compliance with prescribed medications will improve Ability to identify triggers associated with substance abuse/mental health issues will improve Ability to identify changes in lifestyle to reduce recurrence of condition will improve Ability to verbalize feelings will improve Ability to disclose and discuss suicidal ideas Ability to demonstrate self-control will improve Ability to identify and develop effective coping behaviors will improve Ability to maintain clinical measurements within normal limits will improve Compliance with prescribed medications will improve Ability to identify triggers associated with substance abuse/mental health issues will improve     Medication Management: Evaluate patient's response, side effects, and  tolerance of medication regimen.  Therapeutic Interventions: 1 to 1 sessions, Unit Group sessions and Medication administration.  Evaluation of Outcomes: Progressing   RN Treatment Plan for Primary Diagnosis: <principal problem not specified> Long Term Goal(s): Knowledge of disease and therapeutic regimen to maintain health will improve  Short Term Goals: Ability to remain free from injury will improve, Ability to verbalize frustration and anger appropriately will improve, Ability to identify and develop effective coping behaviors will improve and Compliance with prescribed medications will improve  Medication Management: RN will administer medications as ordered by provider, will assess and evaluate patient's response and provide education to patient for prescribed medication. RN will report any adverse and/or side effects to prescribing provider.  Therapeutic Interventions: 1 on 1 counseling sessions, Psychoeducation, Medication administration, Evaluate responses to treatment, Monitor vital signs and CBGs as ordered, Perform/monitor CIWA, COWS, AIMS and Fall Risk screenings as ordered, Perform wound care treatments as ordered.  Evaluation of Outcomes: Progressing   LCSW Treatment Plan for Primary Diagnosis: <principal problem not specified> Long Term Goal(s): Safe transition to appropriate next level of care at discharge, Engage patient in therapeutic group addressing interpersonal concerns.  Short Term Goals: Engage patient in aftercare planning with referrals and resources, Increase social support, Identify triggers associated with mental health/substance abuse issues and Increase skills for wellness and recovery  Therapeutic Interventions: Assess for all discharge needs, 1 to 1 time with Social worker, Explore available resources and support systems, Assess for adequacy in community support network, Educate family and significant other(s) on suicide prevention, Complete Psychosocial  Assessment, Interpersonal group therapy.  Evaluation of Outcomes: Adequate for Discharge   Progress in Treatment: Attending groups: No. Participating in groups: No. Taking medication as prescribed: Yes. Toleration medication: Yes. Family/Significant other contact made: Yes, individual(s) contacted:  friend/"aunt." Patient understands diagnosis: Yes. Discussing patient identified problems/goals with staff: Yes. Medical problems stabilized or resolved: No. Poorly controlled diabetes. Denies suicidal/homicidal ideation: Yes. Issues/concerns per patient self-inventory: Yes.   New problem(s) identified: Yes, Describe:  homelessness, no income, no local supports, poorly managed diabetes  New Short Term/Long Term Goal(s): detox, medication management for mood stabilization; elimination of SI thoughts; development of comprehensive mental wellness/sobriety plan.  Patient Goals:    [facility]charge [facility] or Barriers[facility]  Reason for Continuation of Hospitalization: Anxiety Depression Medical Issues  Estimated Length of Stay: tomorrow  Attendees: Patient: 02/03/2019 10:10 AM  Physician:  02/03/2019 10:10 AM  Nursing:  02/03/2019 10:10 AM  RN Care Manager: 02/03/2019 10:10 AM  Social Worker:  Enid Cutterharlotte Mattox Schorr, LCSWA 02/03/2019 10:10 AM  Recreational Therapist:  02/03/2019 10:10 AM  Other:  02/03/2019 10:10 AM  Other:  02/03/2019 10:10 AM  Other: 02/03/2019 10:10 AM    Scribe for Treatment Team: Darreld Mcleanharlotte C Dayan Desa, LCSWA 02/03/2019 10:10 AM

## 2019-02-03 NOTE — Progress Notes (Signed)
Patient was given another opportunity to get phone numbers from his cell phone. Patient received the numbers and his phone was locked back up in the locker.

## 2019-02-03 NOTE — Progress Notes (Signed)
I was consulted by Dr. Parke Poisson for this gentleman's management of hyperglycemia and type 1 diabetes mellitus.  Patient is on 40 units of Lantus along with sliding scale insulin and NovoLog 5 units 3 times daily pre-meal.  His blood sugar has been very labile with 139 yesterday evening and currently over 400.  Although he is on consistent carbohydrate diet but I was informed by Dr. Parke Poisson that there is a buffet meals at [facility] which makes it very difficult for [place] to manage his diabetes properly as 1 of the most important part in the management is consistent carbohydrate diet.  Based on the information I have currently available, I have increased his pre-meal regime to 10 units 3 times daily and increase his Lantus to 50 units.  His discharge regimen will be decided based on his blood sugars at that time.

## 2019-02-03 NOTE — Progress Notes (Signed)
Patient did not attend wrap up group. 

## 2019-02-03 NOTE — Progress Notes (Signed)
The [facility] leadership is working to secure transportation. [employer] tickets are not available for tomorrow, but are available Friday.  CSW following for disposition.  Stephanie Acre, LCSW-A Clinical Social Worker

## 2019-02-04 ENCOUNTER — Encounter (HOSPITAL_COMMUNITY): Payer: Self-pay | Admitting: Emergency Medicine

## 2019-02-04 ENCOUNTER — Emergency Department (HOSPITAL_COMMUNITY)
Admission: EM | Admit: 2019-02-04 | Discharge: 2019-02-04 | Disposition: A | Discharge status: Home / Self Care | Payer: Self-pay | Attending: Emergency Medicine | Admitting: Emergency Medicine

## 2019-02-04 DIAGNOSIS — F17210 Nicotine dependence, cigarettes, uncomplicated: Secondary | ICD-10-CM | POA: Insufficient documentation

## 2019-02-04 DIAGNOSIS — F32A Depression, unspecified: Secondary | ICD-10-CM

## 2019-02-04 DIAGNOSIS — Z794 Long term (current) use of insulin: Secondary | ICD-10-CM | POA: Insufficient documentation

## 2019-02-04 DIAGNOSIS — F329 Major depressive disorder, single episode, unspecified: Secondary | ICD-10-CM | POA: Insufficient documentation

## 2019-02-04 DIAGNOSIS — E119 Type 2 diabetes mellitus without complications: Secondary | ICD-10-CM | POA: Insufficient documentation

## 2019-02-04 DIAGNOSIS — F1124 Opioid dependence with opioid-induced mood disorder: Secondary | ICD-10-CM

## 2019-02-04 LAB — GLUCOSE, CAPILLARY: Glucose-Capillary: 354 mg/dL — ABNORMAL HIGH (ref 70–99)

## 2019-02-04 MED ORDER — SERTRALINE HCL 100 MG PO TABS: 200.0000 mg | ORAL_TABLET | Freq: Every day | ORAL | Status: DC

## 2019-02-04 MED ORDER — INSULIN ASPART PROT & ASPART (70-30 MIX) 100 UNIT/ML ~~LOC~~ SUSP: 35.0000 [IU] | Freq: Two times a day (BID) | SUBCUTANEOUS | Status: DC

## 2019-02-04 MED ORDER — INSULIN ASPART 100 UNIT/ML ~~LOC~~ SOLN: 0.0000 [IU] | Freq: Three times a day (TID) | SUBCUTANEOUS | 0 refills | Status: DC

## 2019-02-04 MED ORDER — SERTRALINE HCL 100 MG PO TABS: 100.0000 mg | ORAL_TABLET | Freq: Every day | ORAL | 0 refills | Status: DC

## 2019-02-04 MED ORDER — QUETIAPINE FUMARATE 400 MG PO TABS: 400.0000 mg | ORAL_TABLET | Freq: Every day | ORAL | 0 refills | Status: DC

## 2019-02-04 MED ORDER — QUETIAPINE FUMARATE 300 MG PO TABS: 300.0000 mg | ORAL_TABLET | Freq: Every day | ORAL | 0 refills | Status: DC

## 2019-02-04 MED ORDER — QUETIAPINE FUMARATE 400 MG PO TABS
400.0000 mg | ORAL_TABLET | Freq: Every day | ORAL | Status: DC
  Filled 2019-02-04 (×2): qty 7

## 2019-02-04 MED ORDER — TRAZODONE HCL 100 MG PO TABS: 100.0000 mg | ORAL_TABLET | Freq: Every evening | ORAL | 0 refills | Status: DC | PRN

## 2019-02-04 MED ORDER — INSULIN ASPART PROT & ASPART (70-30 MIX) 100 UNIT/ML ~~LOC~~ SUSP: 35.0000 [IU] | Freq: Two times a day (BID) | SUBCUTANEOUS | 0 refills | Status: DC

## 2019-02-04 MED ORDER — QUETIAPINE FUMARATE 300 MG PO TABS: 300.0000 mg | ORAL_TABLET | Freq: Two times a day (BID) | ORAL | Status: DC

## 2019-02-04 MED ORDER — BUPROPION HCL ER (XL) 300 MG PO TB24: 300.0000 mg | ORAL_TABLET | Freq: Every day | ORAL | 0 refills | Status: DC

## 2019-02-04 NOTE — ED Notes (Signed)
Pt removed cardiac monitor, BP and O2 monitoring. Walking around room

## 2019-02-04 NOTE — ED Provider Notes (Signed)
[facility] EMERGENCY DEPARTMENT Provider Note   CSN: [id number] Arrival date & time: 02/04/19  1245    History   Chief Complaint Chief Complaint  Patient presents with  . Behavior Problem    HPI Donald Hartman is a 33 y.o. male with a past medical history of diabetes, depression who presents to ED from bus stop. Patient was discharged from behavioral health hospital approximately 3hrs ago, after arrangements had been made for him to take a bus to [place]. He also received all his prescriptions as well as glucometer kit. EMS was called to bus stop after security was called when patient was sitting at bus stop. EMS unsure why they were called. Patient unsure why he is here. He is not suicidal, homicidal or experiencing any AVH. Denies any complaints.     HPI  Past Medical History:  Diagnosis Date  . Diabetes mellitus without complication [facility])     Patient Active Problem List   Diagnosis Date Noted  . Opioid dependence with opioid-induced mood disorder ([place])   . MDD (major depressive disorder), recurrent episode, severe ([place]) 01/31/2019    History reviewed. No pertinent surgical history.      Home Medications    Prior to Admission medications   Medication Sig Start Date End Date Taking? Authorizing Provider  buPROPion (WELLBUTRIN XL) 300 MG 24 hr tablet Take 1 tablet (300 mg total) by mouth daily. 02/04/19   Connye Burkitt, NP  insulin aspart (NOVOLOG) 100 UNIT/ML injection Inject 0-20 Units into the skin 3 (three) times daily with meals. 02/04/19   Connye Burkitt, NP  insulin aspart protamine- aspart (NOVOLOG MIX 70/30) (70-30) 100 UNIT/ML injection Inject 0.35 mLs (35 Units total) into the skin 2 (two) times daily with a meal. 02/04/19   Connye Burkitt, NP  QUEtiapine (SEROQUEL) 300 MG tablet Take 1 tablet (300 mg total) by mouth daily. 02/05/19   Connye Burkitt, NP  QUEtiapine (SEROQUEL) 400 MG tablet Take 1 tablet (400 mg total) by mouth at bedtime.  02/04/19   Connye Burkitt, NP  sertraline (ZOLOFT) 100 MG tablet Take 1 tablet (100 mg total) by mouth daily. 02/05/19   Connye Burkitt, NP  traZODone (DESYREL) 100 MG tablet Take 1 tablet (100 mg total) by mouth at bedtime as needed for sleep. 02/04/19   Connye Burkitt, NP    Family History No family history on file.  Social History Social History   Tobacco Use  . Smoking status: Current Every Day Smoker  . Smokeless tobacco: Never Used  Substance Use Topics  . Alcohol use: Not on file  . Drug use: Not on file     Allergies   Patient has no known allergies.   Review of Systems Review of Systems  Constitutional: Negative for appetite change, chills and fever.  HENT: Negative for ear pain, rhinorrhea, sneezing and sore throat.   Eyes: Negative for photophobia and visual disturbance.  Respiratory: Negative for cough, chest tightness, shortness of breath and wheezing.   Cardiovascular: Negative for chest pain and palpitations.  Gastrointestinal: Negative for abdominal pain, blood in stool, constipation, diarrhea, nausea and vomiting.  Genitourinary: Negative for dysuria, hematuria and urgency.  Musculoskeletal: Negative for myalgias.  Skin: Negative for rash.  Neurological: Negative for dizziness, weakness and light-headedness.     Physical Exam Updated Vital Signs BP 105/64 (BP Location: Right Arm)   Pulse 83   Temp (!) 97.4 F (36.3 C) (Oral)   Resp 16  SpO2 98%   Physical Exam Vitals signs and nursing note reviewed.  Constitutional:      General: He is not in acute distress.    Appearance: He is well-developed.  HENT:     Head: Normocephalic and atraumatic.     Nose: Nose normal.  Eyes:     General: No scleral icterus.       Left eye: No discharge.     Conjunctiva/sclera: Conjunctivae normal.  Neck:     Musculoskeletal: Normal range of motion and neck supple.  Cardiovascular:     Rate and Rhythm: Normal rate and regular rhythm.     Heart sounds: Normal  heart sounds. No murmur. No friction rub. No gallop.   Pulmonary:     Effort: Pulmonary effort is normal. No respiratory distress.     Breath sounds: Normal breath sounds.  Abdominal:     General: Bowel sounds are normal. There is no distension.     Palpations: Abdomen is soft.     Tenderness: There is no abdominal tenderness. There is no guarding.  Musculoskeletal: Normal range of motion.  Skin:    General: Skin is warm and dry.     Findings: No rash.  Neurological:     Mental Status: He is alert.     Motor: No abnormal muscle tone.     Coordination: Coordination normal.      ED Treatments / Results  Labs (all labs ordered are listed, but only abnormal results are displayed) Labs Reviewed - No data to display  EKG None  Radiology No results found.  Procedures Procedures (including critical care time)  Medications Ordered in ED Medications - No data to display   Initial Impression / Assessment and Plan / ED Course  I have reviewed the triage vital signs and the nursing notes.  Pertinent labs & imaging results that were available during my care of the patient were reviewed by me and considered in my medical decision making (see chart for details).        33yo male with a past medical history of depression presents to ED for unknown reasons.  He was discharged from behavioral health hospital about 3 hours ago.  He was brought in by EMS after security at the bus stop called EMS.  Patient is unsure why he is here.  EMS is unsure why they were called.  Patient denies any SI, HI, AVH.  He was arranged for a bus to get to [place] to live with family.  I see no reason for any work-up at this time.  I did contact behavioral health Hospital who stated that he is medically clear from their standpoint and that they had arranged for him to receive his medications and to go to [facility] and he was not actively expressing any suicidal or homicidal ideation.  Patient discharged with strict  return precautions.  Patient is hemodynamically stable, in NAD, and able to ambulate in the ED. Evaluation does not show pathology that would require ongoing emergent intervention or inpatient treatment. I explained the diagnosis to the patient. Pain has been managed and has no complaints prior to discharge. Patient is comfortable with above plan and is stable for discharge at this time. All questions were answered prior to disposition. Strict return precautions for returning to the ED were discussed. Encouraged follow up with PCP.   An After Visit Summary was printed and given to the patient.   Portions of this note were generated with Lobby[employer]. Dictation  errors may occur despite best attempts at proofreading.   Final Clinical Impressions(s) / ED Diagnoses   Final diagnoses:  Depression, unspecified depression type    ED Discharge Orders    None       Delia Heady, PA-C 02/04/19 1338    Quintella Reichert, MD 02/05/19 [id number]

## 2019-02-04 NOTE — ED Notes (Signed)
Pt noted to be moving around room. Placed belongings in a patient belonging bag and placed them in the hallway. Pt going through belongings. Told RN that if he shut the door he would break it. Door remains open.

## 2019-02-04 NOTE — Progress Notes (Addendum)
Patient was escorted off the unit with security, the [facility], and a tech. Patient was given his bus ticket, samples of medications, and paperwork. Patient was upset because we wouldn't supply him him needles for his insulin, even though he knew he was being discharged to being agitated.   Before discharge, patient was becoming agitated, bizarre, going in and out of patient's rooms stealing their clothes. Writer and other staff suspect he was trying to give [place] a reason not to discharge him.

## 2019-02-04 NOTE — Progress Notes (Signed)
  [facility] Adult Case Management Discharge Plan :  Will you be returning to the same living situation after discharge:  No. Going to [place]. At discharge, do you have transportation home?: Yes,  has been provided a bus ticket.  Do you have the ability to pay for your medications: No. Given samples.   Release of information consent forms completed and in the chart.  Patient to Follow up at: Follow-up Information    [facility] Follow up on 03/02/2019.   Why: Primary care appointment with Briant Sites is Tuesday, 7/21 at 3:00p.  Please bring your photo ID, all medications, and discharge paperwork from this hospitalization.  Contact information: [place] [address] Ph: [phone] Fx: [phone]       [facility] Follow up on 02/11/2019.   Why: Intake appointment for mental health services is Thursday, 7/2 at 8:00a.  Please bring your photo ID, SSN, and all medications.  Contact information: [address]  [place] [address] Ph: [phone] Fx: [phone]          Next level of care provider has access to [place] and Suicide Prevention discussed: Yes,  with "friend" or "aunt."  Have you used any form of tobacco in the last 30 days? (Cigarettes, Smokeless Tobacco, Cigars, and/or Pipes): Yes  Has patient been referred to the Quitline?: Patient refused referral  Patient has been referred for addiction treatment: Pt. refused referral  [name], [place] 02/04/2019, 9:35 AM
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
## 2019-02-04 NOTE — Discharge Instructions (Signed)
Continue your home medications as previously prescribed. °

## 2019-02-04 NOTE — Plan of Care (Signed)
  Problem: Coping: Goal: Coping ability will improve Outcome: Adequate for Discharge   Problem: Health Behavior/Discharge Planning: Goal: Identification of resources available to assist in meeting health care needs will improve Outcome: Adequate for Discharge   Problem: Medication: Goal: Compliance with prescribed medication regimen will improve Outcome: Adequate for Discharge   Problem: Self-Concept: Goal: Ability to disclose and discuss suicidal ideas will improve Outcome: Adequate for Discharge

## 2019-02-04 NOTE — ED Triage Notes (Signed)
Pt arrives with [facility] after security called ems at the bus depot- unsure why security called ems- pt states he was "trying to go home". Pt is alert and oriented to person and place. Pt just left the hospital with a bus ticket to go to [place].
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
## 2019-02-04 NOTE — ED Notes (Signed)
Security redirected pt back to his room to get his belongings and to get his clothes on so he can leave.

## 2019-02-04 NOTE — ED Notes (Signed)
Pt given DC instructions and security was called at this time to be walked out. Pt has a bus pass.
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
## 2019-02-04 NOTE — Progress Notes (Signed)
D:  Patient has been anxious and labile throughout the evening.  He is intermittently angry at staff the tearful and upset about another patient on the hall.  He reports that he is glad that he will be able to return to [facility] on Friday.  He denies any thoughts of hurting himself or others and contracts for safety on the unit.  A:  Medications administered as ordered.  Emotional support provided.  Safety checks q 15 minutes.  R:  Safety maintained on unit.     [place] NOVEL CORONAVIRUS (COVID-19) DAILY CHECK-OFF SYMPTOMS - answer yes or no to each - every day NO YES  Have you had a fever in the past 24 hours?  . Fever (Temp > 37.80C / 100F) X   Have you had any of these symptoms in the past 24 hours? . New Cough .  Sore Throat  .  Shortness of Breath .  Difficulty Breathing .  Unexplained Body Aches   X   Have you had any one of these symptoms in the past 24 hours not related to allergies?   . Runny Nose .  Nasal Congestion .  Sneezing   X   If you have had runny nose, nasal congestion, sneezing in the past 24 hours, has it worsened?  X   EXPOSURES - check yes or no X   Have you traveled outside the state in the past 14 days?  X   Have you been in contact with someone with a confirmed diagnosis of COVID-19 or PUI in the past 14 days without wearing appropriate PPE?  X   Have you been living in the same home as a person with confirmed diagnosis of COVID-19 or a PUI (household contact)?    X   Have you been diagnosed with COVID-19?    X              What to do next: Answered NO to all: Answered YES to anything:   Proceed with unit schedule Follow the [facility] Inpatient Flowsheet.

## 2019-02-07 ENCOUNTER — Emergency Department (HOSPITAL_COMMUNITY): Payer: Self-pay

## 2019-02-07 ENCOUNTER — Emergency Department (HOSPITAL_COMMUNITY)
Admission: EM | Admit: 2019-02-07 | Discharge: 2019-02-07 | Disposition: A | Discharge status: Home / Self Care | Payer: Self-pay | Attending: Emergency Medicine | Admitting: Emergency Medicine

## 2019-02-07 ENCOUNTER — Other Ambulatory Visit: Payer: Self-pay

## 2019-02-07 ENCOUNTER — Encounter (HOSPITAL_COMMUNITY): Payer: Self-pay

## 2019-02-07 DIAGNOSIS — Y929 Unspecified place or not applicable: Secondary | ICD-10-CM | POA: Insufficient documentation

## 2019-02-07 DIAGNOSIS — F329 Major depressive disorder, single episode, unspecified: Secondary | ICD-10-CM | POA: Insufficient documentation

## 2019-02-07 DIAGNOSIS — F1990 Other psychoactive substance use, unspecified, uncomplicated: Secondary | ICD-10-CM | POA: Insufficient documentation

## 2019-02-07 DIAGNOSIS — Y939 Activity, unspecified: Secondary | ICD-10-CM | POA: Insufficient documentation

## 2019-02-07 DIAGNOSIS — Z79899 Other long term (current) drug therapy: Secondary | ICD-10-CM | POA: Insufficient documentation

## 2019-02-07 DIAGNOSIS — Z20828 Contact with and (suspected) exposure to other viral communicable diseases: Secondary | ICD-10-CM | POA: Insufficient documentation

## 2019-02-07 DIAGNOSIS — S21111A Laceration without foreign body of right front wall of thorax without penetration into thoracic cavity, initial encounter: Secondary | ICD-10-CM | POA: Insufficient documentation

## 2019-02-07 DIAGNOSIS — Z79891 Long term (current) use of opiate analgesic: Secondary | ICD-10-CM

## 2019-02-07 DIAGNOSIS — M25531 Pain in right wrist: Secondary | ICD-10-CM | POA: Insufficient documentation

## 2019-02-07 DIAGNOSIS — Z23 Encounter for immunization: Secondary | ICD-10-CM | POA: Insufficient documentation

## 2019-02-07 DIAGNOSIS — E1165 Type 2 diabetes mellitus with hyperglycemia: Secondary | ICD-10-CM | POA: Insufficient documentation

## 2019-02-07 DIAGNOSIS — Z794 Long term (current) use of insulin: Secondary | ICD-10-CM | POA: Insufficient documentation

## 2019-02-07 DIAGNOSIS — Y999 Unspecified external cause status: Secondary | ICD-10-CM | POA: Insufficient documentation

## 2019-02-07 DIAGNOSIS — R739 Hyperglycemia, unspecified: Secondary | ICD-10-CM

## 2019-02-07 DIAGNOSIS — F17210 Nicotine dependence, cigarettes, uncomplicated: Secondary | ICD-10-CM | POA: Insufficient documentation

## 2019-02-07 DIAGNOSIS — S6991XA Unspecified injury of right wrist, hand and finger(s), initial encounter: Secondary | ICD-10-CM

## 2019-02-07 DIAGNOSIS — Z590 Homelessness: Secondary | ICD-10-CM

## 2019-02-07 LAB — COMPREHENSIVE METABOLIC PANEL
ALT: 66 U/L — ABNORMAL HIGH (ref 0–44)
AST: 55 U/L — ABNORMAL HIGH (ref 15–41)
Albumin: 3.3 g/dL — ABNORMAL LOW (ref 3.5–5.0)
Alkaline Phosphatase: 55 U/L (ref 38–126)
Anion gap: 14 (ref 5–15)
BUN: 12 mg/dL (ref 6–20)
CO2: 19 mmol/L — ABNORMAL LOW (ref 22–32)
Calcium: 8.2 mg/dL — ABNORMAL LOW (ref 8.9–10.3)
Chloride: 99 mmol/L (ref 98–111)
Creatinine, Ser: 1.15 mg/dL (ref 0.61–1.24)
GFR calc Af Amer: >60 mL/min (ref >60)
GFR calc non Af Amer: >60 mL/min (ref >60)
Glucose, Bld: 595 mg/dL (ref 70–99)
Potassium: 4.7 mmol/L (ref 3.5–5.1)
Sodium: 132 mmol/L — ABNORMAL LOW (ref 135–145)
Total Bilirubin: 0.9 mg/dL (ref 0.3–1.2)
Total Protein: 6.2 g/dL — ABNORMAL LOW (ref 6.5–8.1)

## 2019-02-07 LAB — CBC WITH DIFFERENTIAL/PLATELET
Abs Immature Granulocytes: 0.01 10*3/uL (ref 0.00–0.07)
Basophils Absolute: 0.1 10*3/uL (ref 0.0–0.1)
Basophils Relative: 1 %
Eosinophils Absolute: 0.2 10*3/uL (ref 0.0–0.5)
Eosinophils Relative: 2 %
HCT: 34.2 % — ABNORMAL LOW (ref 39.0–52.0)
Hemoglobin: 11.5 g/dL — ABNORMAL LOW (ref 13.0–17.0)
Immature Granulocytes: 0 %
Lymphocytes Relative: 20 %
Lymphs Abs: 1.6 10*3/uL (ref 0.7–4.0)
MCH: 29.2 pg (ref 26.0–34.0)
MCHC: 33.6 g/dL (ref 30.0–36.0)
MCV: 86.8 fL (ref 80.0–100.0)
Monocytes Absolute: 0.6 10*3/uL (ref 0.1–1.0)
Monocytes Relative: 8 %
Neutro Abs: 5.3 10*3/uL (ref 1.7–7.7)
Neutrophils Relative %: 69 %
Platelets: 195 10*3/uL (ref 150–400)
RBC: 3.94 MIL/uL — ABNORMAL LOW (ref 4.22–5.81)
RDW: 14.9 % (ref 11.5–15.5)
WBC: 7.7 10*3/uL (ref 4.0–10.5)
nRBC: 0 % (ref 0.0–0.2)

## 2019-02-07 LAB — RAPID URINE DRUG SCREEN, HOSP PERFORMED
Amphetamines: NOT DETECTED
Barbiturates: NOT DETECTED
Benzodiazepines: POSITIVE — AB
Cocaine: NOT DETECTED
Opiates: NOT DETECTED
Tetrahydrocannabinol: POSITIVE — AB

## 2019-02-07 LAB — URINALYSIS, ROUTINE W REFLEX MICROSCOPIC
Bacteria, UA: NONE SEEN
Bilirubin Urine: NEGATIVE
Glucose, UA: >=500 mg/dL — AB
Hgb urine dipstick: NEGATIVE
Ketones, ur: 20 mg/dL — AB
Leukocytes,Ua: NEGATIVE
Nitrite: NEGATIVE
Protein, ur: NEGATIVE mg/dL
Specific Gravity, Urine: 1.029 (ref 1.005–1.030)
pH: 6 (ref 5.0–8.0)

## 2019-02-07 LAB — CBG MONITORING, ED
Glucose-Capillary: 569 mg/dL (ref 70–99)
Glucose-Capillary: 302 mg/dL — ABNORMAL HIGH (ref 70–99)
Glucose-Capillary: 193 mg/dL — ABNORMAL HIGH (ref 70–99)

## 2019-02-07 LAB — SARS CORONAVIRUS 2 BY RT PCR (HOSPITAL ORDER, PERFORMED IN ~~LOC~~ HOSPITAL LAB): SARS Coronavirus 2: NEGATIVE

## 2019-02-07 MED ORDER — DOXYCYCLINE HYCLATE 100 MG PO CAPS: 100.0000 mg | ORAL_CAPSULE | Freq: Two times a day (BID) | ORAL | 0 refills | Status: AC

## 2019-02-07 MED ORDER — OXYCODONE-ACETAMINOPHEN 5-325 MG PO TABS
1.0000 | ORAL_TABLET | Freq: Once | ORAL | Status: AC
  Administered 2019-02-07: 1 via ORAL
  Filled 2019-02-07: qty 1

## 2019-02-07 MED ORDER — MIDAZOLAM HCL 2 MG/2ML IJ SOLN
INTRAMUSCULAR | Status: AC | PRN
  Administered 2019-02-07: 2 mg via INTRAVENOUS

## 2019-02-07 MED ORDER — INSULIN ASPART 100 UNIT/ML ~~LOC~~ SOLN: 0.0000 [IU] | Freq: Three times a day (TID) | SUBCUTANEOUS | 0 refills | Status: DC

## 2019-02-07 MED ORDER — KETAMINE HCL 50 MG/5ML IJ SOSY
1.0000 mg/kg | PREFILLED_SYRINGE | Freq: Once | INTRAMUSCULAR | Status: DC
  Filled 2019-02-07: qty 10

## 2019-02-07 MED ORDER — KETAMINE HCL 10 MG/ML IJ SOLN
INTRAMUSCULAR | Status: AC | PRN
  Administered 2019-02-07: 25 mg via INTRAVENOUS
  Administered 2019-02-07: 50 mg via INTRAVENOUS
  Administered 2019-02-07: 25 mg via INTRAVENOUS

## 2019-02-07 MED ORDER — PROPOFOL 10 MG/ML IV BOLUS
INTRAVENOUS | Status: AC | PRN
  Administered 2019-02-07: 30 mg via INTRAVENOUS
  Administered 2019-02-07: 50 mg via INTRAVENOUS
  Administered 2019-02-07: 20 mg via INTRAVENOUS

## 2019-02-07 MED ORDER — QUETIAPINE FUMARATE 400 MG PO TABS: 400.0000 mg | ORAL_TABLET | Freq: Every day | ORAL | 0 refills | Status: DC

## 2019-02-07 MED ORDER — FENTANYL CITRATE (PF) 100 MCG/2ML IJ SOLN
INTRAMUSCULAR | Status: AC
  Filled 2019-02-07: qty 2

## 2019-02-07 MED ORDER — BLOOD GLUCOSE MONITOR KIT: PACK | 0 refills | Status: DC

## 2019-02-07 MED ORDER — TETANUS-DIPHTH-ACELL PERTUSSIS 5-2.5-18.5 LF-MCG/0.5 IM SUSP
0.5000 mL | Freq: Once | INTRAMUSCULAR | Status: AC
  Administered 2019-02-07: 11:00:00 0.5 mL via INTRAMUSCULAR
  Filled 2019-02-07: qty 0.5

## 2019-02-07 MED ORDER — TRAZODONE HCL 100 MG PO TABS: 100.0000 mg | ORAL_TABLET | Freq: Every evening | ORAL | 0 refills | Status: DC | PRN

## 2019-02-07 MED ORDER — FENTANYL CITRATE (PF) 100 MCG/2ML IJ SOLN
INTRAMUSCULAR | Status: AC | PRN
  Administered 2019-02-07 (×2): 50 ug via INTRAVENOUS

## 2019-02-07 MED ORDER — LIDOCAINE-EPINEPHRINE (PF) 2 %-1:200000 IJ SOLN
10.0000 mL | Freq: Once | INTRAMUSCULAR | Status: AC
  Administered 2019-02-07: 10 mL
  Filled 2019-02-07: qty 20

## 2019-02-07 MED ORDER — PROPOFOL 10 MG/ML IV BOLUS
INTRAVENOUS | Status: AC
  Filled 2019-02-07: qty 20

## 2019-02-07 MED ORDER — INSULIN ASPART 100 UNIT/ML ~~LOC~~ SOLN
10.0000 [IU] | Freq: Once | SUBCUTANEOUS | Status: AC
  Administered 2019-02-07: 10 [IU] via INTRAVENOUS

## 2019-02-07 MED ORDER — MIDAZOLAM HCL 2 MG/2ML IJ SOLN
2.0000 mg | Freq: Once | INTRAMUSCULAR | Status: DC
  Filled 2019-02-07: qty 2

## 2019-02-07 MED ORDER — SODIUM CHLORIDE 0.9 % IV SOLN
INTRAVENOUS | Status: DC
  Administered 2019-02-07: 11:00:00 via INTRAVENOUS

## 2019-02-07 MED ORDER — "INSULIN SYRINGE-NEEDLE U-100 30G X 15/64"" 0.5 ML MISC": 1.0000 | Freq: Every day | 0 refills | Status: DC

## 2019-02-07 MED ORDER — SERTRALINE HCL 100 MG PO TABS: 100.0000 mg | ORAL_TABLET | Freq: Every day | ORAL | 0 refills | Status: DC

## 2019-02-07 MED ORDER — DOXYCYCLINE HYCLATE 100 MG PO TABS
100.0000 mg | ORAL_TABLET | Freq: Once | ORAL | Status: AC
  Administered 2019-02-07: 100 mg via ORAL
  Filled 2019-02-07: qty 1

## 2019-02-07 MED ORDER — INSULIN ASPART 100 UNIT/ML ~~LOC~~ SOLN
10.0000 [IU] | Freq: Once | SUBCUTANEOUS | Status: AC
  Administered 2019-02-07: 15:00:00 10 [IU] via INTRAVENOUS

## 2019-02-07 MED ORDER — MORPHINE SULFATE (PF) 4 MG/ML IV SOLN
4.0000 mg | Freq: Once | INTRAVENOUS | Status: AC
  Administered 2019-02-07: 11:00:00 4 mg via INTRAVENOUS
  Filled 2019-02-07: qty 1

## 2019-02-07 MED ORDER — BUPROPION HCL ER (XL) 300 MG PO TB24: 300.0000 mg | ORAL_TABLET | Freq: Every day | ORAL | 0 refills | Status: DC

## 2019-02-07 MED ORDER — SODIUM CHLORIDE 0.9 % IV BOLUS
1000.0000 mL | Freq: Once | INTRAVENOUS | Status: AC
  Administered 2019-02-07: 1000 mL via INTRAVENOUS

## 2019-02-07 MED ORDER — INSULIN ASPART PROT & ASPART (70-30 MIX) 100 UNIT/ML ~~LOC~~ SUSP: 35.0000 [IU] | Freq: Two times a day (BID) | SUBCUTANEOUS | 0 refills | Status: DC

## 2019-02-07 MED ORDER — FENTANYL CITRATE (PF) 100 MCG/2ML IJ SOLN: 50.0000 ug | Freq: Once | INTRAMUSCULAR | Status: DC

## 2019-02-07 NOTE — ED Notes (Signed)
TTS in process at this time  

## 2019-02-07 NOTE — Discharge Instructions (Addendum)
It is important that you keep your wound clean and covered.  Keep the sling on for comfort and to help your muscle heal. Schedule an appointment in 1 week to follow up with orthopedics for wound check and re-evaluation of your wrist. Take the antibiotic, doxycycline, as prescribed until gone. You can take ibuprofen every 6 hours as needed for pain and swelling. Return to the ER if your wound gets red or you notice pus draining from the wound, fever, or new or worsening symptoms.  Es importante que [place] su herida limpia y [place]. Mantenga la honda puesta para mayor comodidad y para ayudar a que su msculo sane. Programe una cita en 1 semana para realizar un seguimiento con ortopedia para el control de heridas y la reevaluacin de su mueca. [place] el antibitico, doxiciclina, segn lo prescrito hasta que desaparezca. Puede tomar ibuprofeno cada 6 horas segn sea necesario para el dolor y la hinchazn. Regrese a la sala de emergencias si su herida se enrojece o si nota pus saliendo de la herida, fiebre o sntomas nuevos o que [place].

## 2019-02-07 NOTE — Sedation Documentation (Signed)
Irrigating wound with saline
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
## 2019-02-07 NOTE — ED Notes (Signed)
CBG 569 
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
## 2019-02-07 NOTE — Sedation Documentation (Signed)
Bacitracin ointment and ABD pad applied to wound
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
## 2019-02-07 NOTE — Sedation Documentation (Signed)
PA suturing the wound at this time
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
## 2019-02-18 ENCOUNTER — Inpatient Hospital Stay (HOSPITAL_COMMUNITY)
Admission: EM | Admit: 2019-02-18 | Discharge: 2019-02-26 | DRG: 638 | Disposition: A | Discharge status: Home / Self Care | Payer: Self-pay | Attending: Internal Medicine | Admitting: Internal Medicine

## 2019-02-18 ENCOUNTER — Other Ambulatory Visit: Payer: Self-pay

## 2019-02-18 ENCOUNTER — Encounter (HOSPITAL_COMMUNITY): Payer: Self-pay

## 2019-02-18 DIAGNOSIS — E878 Other disorders of electrolyte and fluid balance, not elsewhere classified: Secondary | ICD-10-CM | POA: Diagnosis present

## 2019-02-18 DIAGNOSIS — R45851 Suicidal ideations: Secondary | ICD-10-CM | POA: Diagnosis present

## 2019-02-18 DIAGNOSIS — F1994 Other psychoactive substance use, unspecified with psychoactive substance-induced mood disorder: Secondary | ICD-10-CM

## 2019-02-18 DIAGNOSIS — Z590 Homelessness: Secondary | ICD-10-CM

## 2019-02-18 DIAGNOSIS — R0781 Pleurodynia: Secondary | ICD-10-CM | POA: Diagnosis present

## 2019-02-18 DIAGNOSIS — E871 Hypo-osmolality and hyponatremia: Secondary | ICD-10-CM | POA: Diagnosis present

## 2019-02-18 DIAGNOSIS — F1124 Opioid dependence with opioid-induced mood disorder: Secondary | ICD-10-CM

## 2019-02-18 DIAGNOSIS — N179 Acute kidney failure, unspecified: Secondary | ICD-10-CM | POA: Diagnosis present

## 2019-02-18 DIAGNOSIS — D649 Anemia, unspecified: Secondary | ICD-10-CM | POA: Diagnosis present

## 2019-02-18 DIAGNOSIS — S20319A Abrasion of unspecified front wall of thorax, initial encounter: Secondary | ICD-10-CM

## 2019-02-18 DIAGNOSIS — B1920 Unspecified viral hepatitis C without hepatic coma: Secondary | ICD-10-CM | POA: Diagnosis present

## 2019-02-18 DIAGNOSIS — R7989 Other specified abnormal findings of blood chemistry: Secondary | ICD-10-CM

## 2019-02-18 DIAGNOSIS — F319 Bipolar disorder, unspecified: Secondary | ICD-10-CM | POA: Diagnosis present

## 2019-02-18 DIAGNOSIS — Z79899 Other long term (current) drug therapy: Secondary | ICD-10-CM

## 2019-02-18 DIAGNOSIS — F1123 Opioid dependence with withdrawal: Secondary | ICD-10-CM | POA: Diagnosis present

## 2019-02-18 DIAGNOSIS — F17200 Nicotine dependence, unspecified, uncomplicated: Secondary | ICD-10-CM | POA: Diagnosis present

## 2019-02-18 DIAGNOSIS — E10649 Type 1 diabetes mellitus with hypoglycemia without coma: Secondary | ICD-10-CM | POA: Diagnosis present

## 2019-02-18 DIAGNOSIS — Z794 Long term (current) use of insulin: Secondary | ICD-10-CM

## 2019-02-18 DIAGNOSIS — E1010 Type 1 diabetes mellitus with ketoacidosis without coma: Principal | ICD-10-CM

## 2019-02-18 DIAGNOSIS — Z9119 Patient's noncompliance with other medical treatment and regimen: Secondary | ICD-10-CM

## 2019-02-18 DIAGNOSIS — R945 Abnormal results of liver function studies: Secondary | ICD-10-CM

## 2019-02-18 DIAGNOSIS — F332 Major depressive disorder, recurrent severe without psychotic features: Secondary | ICD-10-CM

## 2019-02-18 DIAGNOSIS — E876 Hypokalemia: Secondary | ICD-10-CM | POA: Diagnosis present

## 2019-02-18 DIAGNOSIS — Z20828 Contact with and (suspected) exposure to other viral communicable diseases: Secondary | ICD-10-CM | POA: Diagnosis present

## 2019-02-18 DIAGNOSIS — E1110 Type 2 diabetes mellitus with ketoacidosis without coma: Secondary | ICD-10-CM | POA: Diagnosis present

## 2019-02-18 LAB — CBC WITH DIFFERENTIAL/PLATELET
Abs Immature Granulocytes: 0.03 10*3/uL (ref 0.00–0.07)
Basophils Absolute: 0 10*3/uL (ref 0.0–0.1)
Basophils Relative: 0 %
Eosinophils Absolute: 0 10*3/uL (ref 0.0–0.5)
Eosinophils Relative: 0 %
HCT: 44.3 % (ref 39.0–52.0)
Hemoglobin: 14.4 g/dL (ref 13.0–17.0)
Immature Granulocytes: 0 %
Lymphocytes Relative: 20 %
Lymphs Abs: 1.8 10*3/uL (ref 0.7–4.0)
MCH: 28.9 pg (ref 26.0–34.0)
MCHC: 32.5 g/dL (ref 30.0–36.0)
MCV: 89 fL (ref 80.0–100.0)
Monocytes Absolute: 0.7 10*3/uL (ref 0.1–1.0)
Monocytes Relative: 7 %
Neutro Abs: 6.6 10*3/uL (ref 1.7–7.7)
Neutrophils Relative %: 73 %
Platelets: 279 10*3/uL (ref 150–400)
RBC: 4.98 MIL/uL (ref 4.22–5.81)
RDW: 15.6 % — ABNORMAL HIGH (ref 11.5–15.5)
WBC: 9.1 10*3/uL (ref 4.0–10.5)
nRBC: 0 % (ref 0.0–0.2)

## 2019-02-18 LAB — BASIC METABOLIC PANEL
Anion gap: 31 — ABNORMAL HIGH (ref 5–15)
Anion gap: 22 — ABNORMAL HIGH (ref 5–15)
BUN: 23 mg/dL — ABNORMAL HIGH (ref 6–20)
BUN: 22 mg/dL — ABNORMAL HIGH (ref 6–20)
CO2: 10 mmol/L — ABNORMAL LOW (ref 22–32)
CO2: 13 mmol/L — ABNORMAL LOW (ref 22–32)
Calcium: 9 mg/dL (ref 8.9–10.3)
Calcium: 8.2 mg/dL — ABNORMAL LOW (ref 8.9–10.3)
Chloride: 92 mmol/L — ABNORMAL LOW (ref 98–111)
Chloride: 102 mmol/L (ref 98–111)
Creatinine, Ser: 1.41 mg/dL — ABNORMAL HIGH (ref 0.61–1.24)
Creatinine, Ser: 1.32 mg/dL — ABNORMAL HIGH (ref 0.61–1.24)
GFR calc Af Amer: >60 mL/min (ref >60)
GFR calc Af Amer: >60 mL/min (ref >60)
GFR calc non Af Amer: >60 mL/min (ref >60)
GFR calc non Af Amer: >60 mL/min (ref >60)
Glucose, Bld: 527 mg/dL (ref 70–99)
Glucose, Bld: 271 mg/dL — ABNORMAL HIGH (ref 70–99)
Potassium: 5 mmol/L (ref 3.5–5.1)
Potassium: 4.3 mmol/L (ref 3.5–5.1)
Sodium: 133 mmol/L — ABNORMAL LOW (ref 135–145)
Sodium: 137 mmol/L (ref 135–145)

## 2019-02-18 LAB — CBG MONITORING, ED
Glucose-Capillary: 469 mg/dL — ABNORMAL HIGH (ref 70–99)
Glucose-Capillary: 570 mg/dL (ref 70–99)
Glucose-Capillary: 406 mg/dL — ABNORMAL HIGH (ref 70–99)
Glucose-Capillary: 331 mg/dL — ABNORMAL HIGH (ref 70–99)

## 2019-02-18 LAB — MAGNESIUM: Magnesium: 2.4 mg/dL (ref 1.7–2.4)

## 2019-02-18 LAB — GLUCOSE, CAPILLARY
Glucose-Capillary: 279 mg/dL — ABNORMAL HIGH (ref 70–99)
Glucose-Capillary: 242 mg/dL — ABNORMAL HIGH (ref 70–99)
Glucose-Capillary: 154 mg/dL — ABNORMAL HIGH (ref 70–99)

## 2019-02-18 LAB — LIPASE, BLOOD: Lipase: 32 U/L (ref 11–51)

## 2019-02-18 LAB — HEPATIC FUNCTION PANEL
ALT: 104 U/L — ABNORMAL HIGH (ref 0–44)
AST: 84 U/L — ABNORMAL HIGH (ref 15–41)
Albumin: 4.7 g/dL (ref 3.5–5.0)
Alkaline Phosphatase: 87 U/L (ref 38–126)
Bilirubin, Direct: 0.3 mg/dL — ABNORMAL HIGH (ref 0.0–0.2)
Indirect Bilirubin: 1.2 mg/dL — ABNORMAL HIGH (ref 0.3–0.9)
Total Bilirubin: 1.5 mg/dL — ABNORMAL HIGH (ref 0.3–1.2)
Total Protein: 8.3 g/dL — ABNORMAL HIGH (ref 6.5–8.1)

## 2019-02-18 LAB — SARS CORONAVIRUS 2 BY RT PCR (HOSPITAL ORDER, PERFORMED IN ~~LOC~~ HOSPITAL LAB): SARS Coronavirus 2: NEGATIVE

## 2019-02-18 LAB — PHOSPHORUS: Phosphorus: 2.9 mg/dL (ref 2.5–4.6)

## 2019-02-18 MED ORDER — ACETAMINOPHEN 500 MG PO TABS
1000.0000 mg | ORAL_TABLET | Freq: Once | ORAL | Status: AC
  Administered 2019-02-18: 1000 mg via ORAL
  Filled 2019-02-18: qty 2

## 2019-02-18 MED ORDER — SODIUM CHLORIDE 0.45 % IV SOLN
INTRAVENOUS | Status: DC
  Administered 2019-02-18: 75 mL/h via INTRAVENOUS
  Administered 2019-02-18: 19:00:00 via INTRAVENOUS

## 2019-02-18 MED ORDER — LORAZEPAM 1 MG PO TABS
2.0000 mg | ORAL_TABLET | ORAL | Status: DC | PRN
  Administered 2019-02-19: 01:00:00 2 mg via ORAL
  Filled 2019-02-18: qty 2

## 2019-02-18 MED ORDER — QUETIAPINE FUMARATE 100 MG PO TABS
400.0000 mg | ORAL_TABLET | Freq: Every day | ORAL | Status: DC
  Administered 2019-02-18 – 2019-02-20 (×3): 400 mg via ORAL
  Filled 2019-02-18 (×3): qty 4

## 2019-02-18 MED ORDER — METOCLOPRAMIDE HCL 5 MG/ML IJ SOLN
10.0000 mg | Freq: Once | INTRAMUSCULAR | Status: AC
  Administered 2019-02-18: 10 mg via INTRAVENOUS
  Filled 2019-02-18: qty 2

## 2019-02-18 MED ORDER — ENOXAPARIN SODIUM 40 MG/0.4ML ~~LOC~~ SOLN
40.0000 mg | SUBCUTANEOUS | Status: DC
  Administered 2019-02-18: 22:00:00 40 mg via SUBCUTANEOUS
  Filled 2019-02-18 (×5): qty 0.4

## 2019-02-18 MED ORDER — ONDANSETRON HCL 4 MG PO TABS: 4.0000 mg | ORAL_TABLET | Freq: Four times a day (QID) | ORAL | Status: DC | PRN

## 2019-02-18 MED ORDER — DIPHENHYDRAMINE HCL 50 MG/ML IJ SOLN
25.0000 mg | Freq: Once | INTRAMUSCULAR | Status: AC
  Administered 2019-02-18: 25 mg via INTRAVENOUS
  Filled 2019-02-18: qty 1

## 2019-02-18 MED ORDER — HYDROCORTISONE 0.5 % EX CREA
TOPICAL_CREAM | Freq: Two times a day (BID) | CUTANEOUS | Status: DC
  Administered 2019-02-18: via TOPICAL
  Administered 2019-02-19: 1 via TOPICAL
  Administered 2019-02-20: 10:00:00 via TOPICAL
  Filled 2019-02-18: qty 28.35

## 2019-02-18 MED ORDER — TRAZODONE HCL 100 MG PO TABS
100.0000 mg | ORAL_TABLET | Freq: Every evening | ORAL | Status: DC | PRN
  Administered 2019-02-19 – 2019-02-25 (×7): 100 mg via ORAL
  Filled 2019-02-18 (×7): qty 1

## 2019-02-18 MED ORDER — POTASSIUM CHLORIDE 10 MEQ/100ML IV SOLN
10.0000 meq | INTRAVENOUS | Status: AC
  Administered 2019-02-18: 19:00:00 10 meq via INTRAVENOUS
  Filled 2019-02-18: qty 100

## 2019-02-18 MED ORDER — ONDANSETRON HCL 4 MG/2ML IJ SOLN: 4.0000 mg | Freq: Four times a day (QID) | INTRAMUSCULAR | Status: DC | PRN

## 2019-02-18 MED ORDER — BUPROPION HCL ER (XL) 300 MG PO TB24
300.0000 mg | ORAL_TABLET | Freq: Every day | ORAL | Status: DC
  Administered 2019-02-18 – 2019-02-26 (×9): 300 mg via ORAL
  Filled 2019-02-18 (×9): qty 1

## 2019-02-18 MED ORDER — SODIUM CHLORIDE 0.9 % IV BOLUS
2000.0000 mL | Freq: Once | INTRAVENOUS | Status: AC
  Administered 2019-02-18: 16:00:00 2000 mL via INTRAVENOUS

## 2019-02-18 MED ORDER — LORAZEPAM 2 MG/ML IJ SOLN: 2.0000 mg | INTRAMUSCULAR | Status: DC | PRN

## 2019-02-18 MED ORDER — INSULIN REGULAR(HUMAN) IN NACL 100-0.9 UT/100ML-% IV SOLN
INTRAVENOUS | Status: DC
  Administered 2019-02-18: 19:00:00 3.5 [IU]/h via INTRAVENOUS
  Filled 2019-02-18: qty 100

## 2019-02-18 MED ORDER — BUPRENORPHINE HCL-NALOXONE HCL 8-2 MG SL SUBL
1.0000 | SUBLINGUAL_TABLET | Freq: Every day | SUBLINGUAL | Status: DC
  Administered 2019-02-18 – 2019-02-19 (×2): 1 via SUBLINGUAL
  Filled 2019-02-18 (×2): qty 1

## 2019-02-18 MED ORDER — DEXTROSE-NACL 5-0.45 % IV SOLN
INTRAVENOUS | Status: DC
  Administered 2019-02-18: 23:00:00 75 mL/h via INTRAVENOUS

## 2019-02-18 MED ORDER — SERTRALINE HCL 100 MG PO TABS
100.0000 mg | ORAL_TABLET | Freq: Every day | ORAL | Status: DC
  Administered 2019-02-18 – 2019-02-26 (×9): 100 mg via ORAL
  Filled 2019-02-18 (×9): qty 1

## 2019-02-18 MED ORDER — KETOROLAC TROMETHAMINE 30 MG/ML IJ SOLN
15.0000 mg | Freq: Once | INTRAMUSCULAR | Status: AC
  Administered 2019-02-18: 17:00:00 15 mg via INTRAVENOUS
  Filled 2019-02-18: qty 1

## 2019-02-18 NOTE — ED Notes (Signed)
ED TO INPATIENT HANDOFF REPORT  Name/Age/Gender Donald Hartman 33 y.o. male  Code Status Code Status History    Date Active Date Inactive Code Status Order ID Comments User Context   01/31/2019 2956 02/04/2019 1245 Full Code [id number]  Money, Lowry Ram, FNP Inpatient   Advance Care Planning Activity      Home/SNF/Other Home  Chief Complaint Hyperglycemia  Level of Care/Admitting Diagnosis ED Disposition    ED Disposition Condition [facility] Area: [place] [[id number]]  Level of Care: Stepdown [14]  Admit to SDU based on following criteria: Severe physiological/psychological symptoms:  Any diagnosis requiring assessment & intervention at least every 4 hours on an ongoing basis to obtain desired patient outcomes including stability and rehabilitation  Covid Evaluation: Asymptomatic Screening Protocol (No Symptoms)  Diagnosis: DKA (diabetic ketoacidoses) [facility]) [[id number]]  Admitting Physician: Barb Merino [[id number]]  Attending Physician: Barb Merino [[id number]]  Estimated length of stay: past midnight tomorrow  Certification:: I certify this patient will need inpatient services for at least 2 midnights  PT Class (Do Not Modify): Inpatient [101]  PT Acc Code (Do Not Modify): Private [1]       Medical History Past Medical History:  Diagnosis Date  . Diabetes mellitus without complication ([place])     Allergies No Known Allergies  IV Location/Drains/Wounds Patient Lines/Drains/Airways Status   Active Line/Drains/Airways    Name:   Placement date:   Placement time:   Site:   Days:   Peripheral IV 02/18/19 Left;Anterior Arm   02/18/19    -    Arm   less than 1   Peripheral IV 02/18/19 Right Forearm   02/18/19    1838    Forearm   less than 1          Labs/Imaging Results for orders placed or performed during the hospital encounter of 02/18/19 (from the past 48 hour(s))  CBG monitoring, ED     Status: Abnormal   Collection Time:  02/18/19  3:36 PM  Result Value Ref Range   Glucose-Capillary 469 (H) 70 - 99 mg/dL  CBG monitoring, ED     Status: Abnormal   Collection Time: 02/18/19  4:01 PM  Result Value Ref Range   Glucose-Capillary 570 (HH) 70 - 99 mg/dL  CBC with Differential     Status: Abnormal   Collection Time: 02/18/19  4:07 PM  Result Value Ref Range   WBC 9.1 4.0 - 10.5 K/uL   RBC 4.98 4.22 - 5.81 MIL/uL   Hemoglobin 14.4 13.0 - 17.0 g/dL   HCT 44.3 39.0 - 52.0 %   MCV 89.0 80.0 - 100.0 fL   MCH 28.9 26.0 - 34.0 pg   MCHC 32.5 30.0 - 36.0 g/dL   RDW 15.6 (H) 11.5 - 15.5 %   Platelets 279 150 - 400 K/uL   nRBC 0.0 0.0 - 0.2 %   Neutrophils Relative % 73 %   Neutro Abs 6.6 1.7 - 7.7 K/uL   Lymphocytes Relative 20 %   Lymphs Abs 1.8 0.7 - 4.0 K/uL   Monocytes Relative 7 %   Monocytes Absolute 0.7 0.1 - 1.0 K/uL   Eosinophils Relative 0 %   Eosinophils Absolute 0.0 0.0 - 0.5 K/uL   Basophils Relative 0 %   Basophils Absolute 0.0 0.0 - 0.1 K/uL   Immature Granulocytes 0 %   Abs Immature Granulocytes 0.03 0.00 - 0.07 K/uL    Comment: Performed at [facility], [address] [place], [place] [address]  Basic metabolic panel     Status: Abnormal   Collection Time: 02/18/19  4:07 PM  Result Value Ref Range   Sodium 133 (L) 135 - 145 mmol/L   Potassium 5.0 3.5 - 5.1 mmol/L   Chloride 92 (L) 98 - 111 mmol/L   CO2 10 (L) 22 - 32 mmol/L   Glucose, Bld 527 (HH) 70 - 99 mg/dL    Comment: CRITICAL RESULT CALLED TO, READ BACK BY AND VERIFIED WITH: S.BINGHAM AT 1704 ON 02/18/19 BY N.THOMPSON    BUN 23 (H) 6 - 20 mg/dL   Creatinine, Ser 6.041.41 (H) 0.61 - 1.24 mg/dL   Calcium 9.0 8.9 - 54.010.3 mg/dL   GFR calc non Af Amer >60 >60 mL/min   GFR calc Af Amer >60 >60 mL/min   Anion gap 31 (H) 5 - 15    Comment: REPEATED TO VERIFY Performed at [facility], [address] [place], [place] [address]   Lipase, blood     Status: None   Collection Time: 02/18/19  4:23 PM   Result Value Ref Range   Lipase 32 11 - 51 U/L    Comment: Performed at [facility], [address] [place], [place] [address]  Hepatic function panel     Status: Abnormal   Collection Time: 02/18/19  4:23 PM  Result Value Ref Range   Total Protein 8.3 (H) 6.5 - 8.1 g/dL   Albumin 4.7 3.5 - 5.0 g/dL   AST 84 (H) 15 - 41 U/L   ALT 104 (H) 0 - 44 U/L   Alkaline Phosphatase 87 38 - 126 U/L   Total Bilirubin 1.5 (H) 0.3 - 1.2 mg/dL   Bilirubin, Direct 0.3 (H) 0.0 - 0.2 mg/dL   Indirect Bilirubin 1.2 (H) 0.3 - 0.9 mg/dL    Comment: Performed at [facility], [address] [place], [place] [address]  CBG monitoring, ED     Status: Abnormal   Collection Time: 02/18/19  6:40 PM  Result Value Ref Range   Glucose-Capillary 406 (H) 70 - 99 mg/dL  CBG monitoring, ED     Status: Abnormal   Collection Time: 02/18/19  7:51 PM  Result Value Ref Range   Glucose-Capillary 331 (H) 70 - 99 mg/dL   No results found.  Pending Labs [employer] Labs (From admission, onward)    Start     Ordered   02/18/19 1734  SARS Coronavirus 2 (CEPHEID - Performed in [facility] lab), Hosp Order  (Asymptomatic Patients Labs)  Once,   STAT    Question:  Rule Out  Answer:  Yes   02/18/19 1733   Signed and Held  HIV antibody (Routine Testing)  Once,   R     Signed and Held   Signed and Held  Basic metabolic panel  Now then every 6 hours,   R     Signed and Held   Signed and Held  Magnesium  5A & 5P,   R     Signed and Held   Signed and Held  Phosphorus  5A & 5P,   R     Signed and Held   Signed and Held  CBC  Tomorrow morning,   R     Signed and Held          Vitals/Pain Today's Vitals   02/18/19 1528 02/18/19 1534 02/18/19 1949 02/18/19 1949  BP: (!) 136/96  122/86  Pulse: 94  99   Resp: 18  18   Temp:  98.2 F (36.8 C)    TempSrc:  Oral    SpO2: 99% 97% 100%   Weight:  78.9 kg    Height:  5\' 9"  (1.753 m)    PainSc:   10-Worst pain ever 10-Worst  pain ever    Isolation Precautions No active isolations  Medications Medications  0.45 % sodium chloride infusion ( Intravenous New Bag/Given 02/18/19 1837)  dextrose 5 %-0.45 % sodium chloride infusion ( Intravenous Hold 02/18/19 1939)  insulin regular, human (MYXREDLIN) 100 units/ 100 mL infusion (2.7 Units/hr Intravenous Rate/Dose Change 02/18/19 1952)  potassium chloride 10 mEq in 100 mL IVPB (10 mEq Intravenous New Bag/Given 02/18/19 1838)  buprenorphine-naloxone (SUBOXONE) 8-2 mg per SL tablet 1 tablet (1 tablet Sublingual Given 02/18/19 1949)  sodium chloride 0.9 % bolus 2,000 mL (0 mLs Intravenous Stopped 02/18/19 1940)  ketorolac (TORADOL) 30 MG/ML injection 15 mg (15 mg Intravenous Given 02/18/19 1654)  acetaminophen (TYLENOL) tablet 1,000 mg (1,000 mg Oral Given 02/18/19 1655)  metoCLOPramide (REGLAN) injection 10 mg (10 mg Intravenous Given 02/18/19 1651)  diphenhydrAMINE (BENADRYL) injection 25 mg (25 mg Intravenous Given 02/18/19 1653)    Mobility walks
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
## 2019-02-18 NOTE — H&P (Signed)
History and Physical    Donald Hartman KDT:[id number] DOB: [birth year] DOA: 02/18/2019  PCP: Patient, No Pcp Per  Patient coming from: Street  I have personally briefly reviewed patient's old medical records available.   Chief Complaint: Whole body ache, nausea, sneezing, vomiting.  HPI: Donald Hartman is a 33 y.o. male with medical history significant of polysubstance abuse, cocaine, heroin, alcohol, type 1 diabetes and bipolar disorder presenting to the emergency room with multiple complaints of nausea vomiting body ache and abdominal pain.  Unfortunate gentleman who recently was traveling from [place] to [place] and got off in [facility], admitted to different hospitals multiple times with drug abuse and DKA, travel to [place] and now at [facility].  Recent admission for DKA, suicidal ideation, abnormal behavior and aggressiveness.  According to the patient, he was discharged from behavioral health last month, he could not afford any prescriptions so he could not take Suboxone and instead went back to doing heroin, last dose being 2 days ago.  He is living in the streets and is homeless.  For about 5 days now he has not been feeling well including nausea, poor appetite, vague abdominal pain about 5 out of 10 in intensity.  Feels very fatigued and weak with pain all over, muscle aches.  He has not taken insulin for about 2 weeks now.  Symptoms are gradual in onset and worsening in severity.  Patient was sitting in the sun all day today has not eaten much, he called EMS.  They found blood sugar 599 and brought to the ER.  Denies any suicidal or homicidal ideation. ED Course: On room air.  Vitals are stable.  Blood sugar 527, creatinine 1.4 and anion gap of 31.  Given 2 L normal saline bolus in the ER and is started on titratable insulin drip and needs admission.  Patient was given 1 dose of Suboxone in the ER tonight.  Patient has not used any Suboxone and unlikely to get more  Suboxone after discharge. On discharge from psychiatric unit, he was willing to go to [place] and he was provided with bus ticket however he lost it.  Review of Systems: As per HPI otherwise 10 point review of systems negative.    Past Medical History:  Diagnosis Date  . Diabetes mellitus without complication ([place])     History reviewed. No pertinent surgical history.   reports that he has been smoking. He has never used smokeless tobacco. No history on file for alcohol and drug.  No Known Allergies  No family history on file.   Prior to Admission medications   Medication Sig Start Date End Date Taking? Authorizing Provider  blood glucose meter kit and supplies KIT Dispense based on patient and insurance preference. Use up to four times daily as directed. (FOR ICD-9 250.00, 250.01). 02/07/19   Robinson, [place] N, PA-C  buprenorphine (SUBUTEX) 8 MG SUBL SL tablet Place 8 mg under the tongue daily.    [provider]  buPROPion (WELLBUTRIN XL) 300 MG 24 hr tablet Take 1 tablet (300 mg total) by mouth daily. 02/07/19   Robinson, [place] N, PA-C  insulin aspart (NOVOLOG) 100 UNIT/ML injection Inject 0-20 Units into the skin 3 (three) times daily with meals. 02/07/19   Robinson, [place] N, PA-C  insulin aspart protamine- aspart (NOVOLOG MIX 70/30) (70-30) 100 UNIT/ML injection Inject 0.35 mLs (35 Units total) into the skin 2 (two) times daily with a meal. 02/07/19   Robinson, [place] N, PA-C  Insulin  Syringe-Needle U-100 30G X 15/64" 0.5 ML MISC 1 Syringe by Does not apply route daily. 1 Insulin syringe-needle, 5 times daily 02/07/19   Robinson, [place] N, PA-C  QUEtiapine (SEROQUEL) 300 MG tablet Take 1 tablet (300 mg total) by mouth daily. 02/05/19   Connye Burkitt, NP  QUEtiapine (SEROQUEL) 400 MG tablet Take 1 tablet (400 mg total) by mouth at bedtime. 02/07/19   Robinson, [place] N, PA-C  sertraline (ZOLOFT) 100 MG tablet Take 1 tablet (100 mg total) by mouth daily. 02/07/19   Robinson,  [place] N, PA-C  traZODone (DESYREL) 100 MG tablet Take 1 tablet (100 mg total) by mouth at bedtime as needed for sleep. 02/07/19   Robinson, [place] N, PA-C    Physical Exam: Vitals:   02/18/19 1528 02/18/19 1534  BP: (!) 136/96   Pulse: 94   Resp: 18   Temp:  98.2 F (36.8 C)  TempSrc:  Oral  SpO2: 99% 97%  Weight:  78.9 kg  Height:  '5\' 9"'  (1.753 m)    Constitutional: NAD, calm, comfortable Vitals:   02/18/19 1528 02/18/19 1534  BP: (!) 136/96   Pulse: 94   Resp: 18   Temp:  98.2 F (36.8 C)  TempSrc:  Oral  SpO2: 99% 97%  Weight:  78.9 kg  Height:  '5\' 9"'  (1.753 m)   Eyes: PERRL, lids and conjunctivae normal, sick looking. ENMT: Mucous membranes are dry. Posterior pharynx clear of any exudate or lesions.Normal dentition.  Neck: normal, supple, no masses, no thyromegaly Respiratory: clear to auscultation bilaterally, no wheezing, no crackles. Normal respiratory effort. No accessory muscle use.  Cardiovascular: Regular rate and rhythm, no murmurs / rubs / gallops. No extremity edema. 2+ pedal pulses. No carotid bruits.  Abdomen: no tenderness, no masses palpated. No hepatosplenomegaly. Bowel sounds positive.  No rigidity or guarding. Musculoskeletal: no clubbing / cyanosis. No joint deformity upper and lower extremities. Good ROM, no contractures. Normal muscle tone.  Skin: no rashes, lesions, ulcers. No induration Neurologic: CN 2-12 grossly intact. Sensation intact, DTR normal. Strength 5/5 in all 4.  Psychiatric: Normal judgment and insight. Alert and oriented x 3.  Anxious and tremulous.    Labs on Admission: I have personally reviewed following labs and imaging studies  CBC: Recent Labs  Lab 02/18/19 1607  WBC 9.1  NEUTROABS 6.6  HGB 14.4  HCT 44.3  MCV 89.0  PLT 160   Basic Metabolic Panel: Recent Labs  Lab 02/18/19 1607  NA 133*  K 5.0  CL 92*  CO2 10*  GLUCOSE 527*  BUN 23*  CREATININE 1.41*  CALCIUM 9.0   GFR: Estimated Creatinine  Clearance: 74.5 mL/min (A) (by C-G formula based on SCr of 1.41 mg/dL (H)). Liver Function Tests: Recent Labs  Lab 02/18/19 1623  AST 84*  ALT 104*  ALKPHOS 87  BILITOT 1.5*  PROT 8.3*  ALBUMIN 4.7   Recent Labs  Lab 02/18/19 1623  LIPASE 32   No results for input(s): AMMONIA in the last 168 hours. Coagulation Profile: No results for input(s): INR, PROTIME in the last 168 hours. Cardiac Enzymes: No results for input(s): CKTOTAL, CKMB, CKMBINDEX, TROPONINI in the last 168 hours. BNP (last 3 results) No results for input(s): PROBNP in the last 8760 hours. HbA1C: No results for input(s): HGBA1C in the last 72 hours. CBG: Recent Labs  Lab 02/18/19 1536 02/18/19 1601  GLUCAP 469* 570*   Lipid Profile: No results for input(s): CHOL, HDL, LDLCALC, TRIG, CHOLHDL, LDLDIRECT in the  last 72 hours. Thyroid Function Tests: No results for input(s): TSH, T4TOTAL, FREET4, T3FREE, THYROIDAB in the last 72 hours. Anemia Panel: No results for input(s): VITAMINB12, FOLATE, FERRITIN, TIBC, IRON, RETICCTPCT in the last 72 hours. Urine analysis:    Component Value Date/Time   COLORURINE YELLOW 02/07/2019 1130   APPEARANCEUR CLEAR 02/07/2019 1130   LABSPEC 1.029 02/07/2019 1130   PHURINE 6.0 02/07/2019 1130   GLUCOSEU >=500 (A) 02/07/2019 1130   HGBUR NEGATIVE 02/07/2019 1130   BILIRUBINUR NEGATIVE 02/07/2019 1130   KETONESUR 20 (A) 02/07/2019 1130   PROTEINUR NEGATIVE 02/07/2019 1130   NITRITE NEGATIVE 02/07/2019 1130   LEUKOCYTESUR NEGATIVE 02/07/2019 1130    Radiological Exams on Admission: No results found.  EKG: Independently reviewed.  EKG from today pending.  Recent EKG with QTC of 447.  Assessment/Plan Principal Problem:   DKA, type 1 ([place]) Active Problems:   MDD (major depressive disorder), recurrent episode, severe ([facility])   Opioid dependence with opioid-induced mood disorder ([place])   DKA (diabetic ketoacidoses) ([place])     1.  Diabetic ketoacidosis with history of  type 1 diabetes: Admit to monitored unit given severity of symptoms. Vital signs every 4 hours. N.p.o. until anion gap is closed.  Will allow non-carb liquids. Blood sugars every hour until on IV insulin. BMP every 6 hours until on IV insulin.  Keep potassium 3.5-5, supplement as per protocol. Check magnesium and phosphorus every 12 hours. Bolus IV fluids normal saline given in the ER. Keep on saline until blood sugars more than 250. When blood sugars less than 250, changed to 5% dextrose and continue insulin regimen. Will transition to subcu insulin once patient is able to take by mouth as well anion gap is closed. Hemoglobin A1c is not available, will check.   2.  Opiate dependence with withdrawal: Patient is already showing evidence of withdrawal including sneezing and rhinorrhea.  Patient has responded well to Suboxone in the past.  Patient is less likely to follow-up and receive Suboxone as outpatient, however he is going to withdrawal so we will start it.  3.  Major depressive disorder: Patient on multiple antidepressants currently not taking.  Will resume and will see if he can continue to take it.  4.  Acute renal failure: Due to 1.  Aggressively hydrate and monitor.   DVT prophylaxis: Subcu Lovenox Code Status: Full code Family Communication: None Disposition Plan: Unknown at this time Consults called: None Admission status: Inpatient to stepdown unit with insulin infusion protocol.   Barb Merino MD [facility] Pager [phone]  If 7PM-7AM, please contact night-coverage [email or web address] Password [facility]  02/18/2019, 6:34 PM

## 2019-02-19 DIAGNOSIS — F1994 Other psychoactive substance use, unspecified with psychoactive substance-induced mood disorder: Secondary | ICD-10-CM

## 2019-02-19 LAB — BASIC METABOLIC PANEL
Anion gap: 9 (ref 5–15)
BUN: 20 mg/dL (ref 6–20)
CO2: 18 mmol/L — ABNORMAL LOW (ref 22–32)
Calcium: 7.7 mg/dL — ABNORMAL LOW (ref 8.9–10.3)
Chloride: 106 mmol/L (ref 98–111)
Creatinine, Ser: 0.88 mg/dL (ref 0.61–1.24)
GFR calc Af Amer: >60 mL/min (ref >60)
GFR calc non Af Amer: >60 mL/min (ref >60)
Glucose, Bld: 141 mg/dL — ABNORMAL HIGH (ref 70–99)
Potassium: 3.9 mmol/L (ref 3.5–5.1)
Sodium: 133 mmol/L — ABNORMAL LOW (ref 135–145)

## 2019-02-19 LAB — GLUCOSE, CAPILLARY
Glucose-Capillary: 117 mg/dL — ABNORMAL HIGH (ref 70–99)
Glucose-Capillary: 132 mg/dL — ABNORMAL HIGH (ref 70–99)
Glucose-Capillary: 133 mg/dL — ABNORMAL HIGH (ref 70–99)
Glucose-Capillary: 159 mg/dL — ABNORMAL HIGH (ref 70–99)
Glucose-Capillary: 161 mg/dL — ABNORMAL HIGH (ref 70–99)
Glucose-Capillary: 195 mg/dL — ABNORMAL HIGH (ref 70–99)
Glucose-Capillary: 225 mg/dL — ABNORMAL HIGH (ref 70–99)
Glucose-Capillary: 235 mg/dL — ABNORMAL HIGH (ref 70–99)
Glucose-Capillary: 239 mg/dL — ABNORMAL HIGH (ref 70–99)
Glucose-Capillary: 243 mg/dL — ABNORMAL HIGH (ref 70–99)
Glucose-Capillary: 303 mg/dL — ABNORMAL HIGH (ref 70–99)

## 2019-02-19 LAB — CBC
HCT: 35.6 % — ABNORMAL LOW (ref 39.0–52.0)
Hemoglobin: 11.5 g/dL — ABNORMAL LOW (ref 13.0–17.0)
MCH: 28.8 pg (ref 26.0–34.0)
MCHC: 32.3 g/dL (ref 30.0–36.0)
MCV: 89.2 fL (ref 80.0–100.0)
Platelets: 184 10*3/uL (ref 150–400)
RBC: 3.99 MIL/uL — ABNORMAL LOW (ref 4.22–5.81)
RDW: 15.6 % — ABNORMAL HIGH (ref 11.5–15.5)
WBC: 7.8 10*3/uL (ref 4.0–10.5)
nRBC: 0 % (ref 0.0–0.2)

## 2019-02-19 LAB — HIV ANTIBODY (ROUTINE TESTING W REFLEX): HIV Screen 4th Generation wRfx: NONREACTIVE

## 2019-02-19 LAB — MRSA PCR SCREENING: MRSA by PCR: NEGATIVE

## 2019-02-19 LAB — PHOSPHORUS: Phosphorus: 1.8 mg/dL — ABNORMAL LOW (ref 2.5–4.6)

## 2019-02-19 LAB — MAGNESIUM: Magnesium: 2.1 mg/dL (ref 1.7–2.4)

## 2019-02-19 MED ORDER — INSULIN ASPART PROT & ASPART (70-30 MIX) 100 UNIT/ML ~~LOC~~ SUSP
17.0000 [IU] | Freq: Two times a day (BID) | SUBCUTANEOUS | Status: DC
  Administered 2019-02-19 (×2): 17 [IU] via SUBCUTANEOUS
  Filled 2019-02-19 (×2): qty 10

## 2019-02-19 MED ORDER — BUPRENORPHINE HCL-NALOXONE HCL 8-2 MG SL SUBL
1.0000 | SUBLINGUAL_TABLET | Freq: Two times a day (BID) | SUBLINGUAL | Status: DC
  Administered 2019-02-19 – 2019-02-26 (×14): 1 via SUBLINGUAL
  Filled 2019-02-19 (×14): qty 1

## 2019-02-19 MED ORDER — POTASSIUM PHOSPHATES 15 MMOLE/5ML IV SOLN
20.0000 meq | Freq: Once | INTRAVENOUS | Status: AC
  Administered 2019-02-19: 20 meq via INTRAVENOUS
  Filled 2019-02-19: qty 4.55

## 2019-02-19 MED ORDER — CHLORHEXIDINE GLUCONATE CLOTH 2 % EX PADS: 6.0000 | MEDICATED_PAD | Freq: Every day | CUTANEOUS | Status: DC

## 2019-02-19 MED ORDER — K PHOS MONO-SOD PHOS DI & MONO 155-852-130 MG PO TABS
250.0000 mg | ORAL_TABLET | Freq: Three times a day (TID) | ORAL | Status: DC
  Administered 2019-02-19 – 2019-02-24 (×16): 250 mg via ORAL
  Filled 2019-02-19 (×18): qty 1

## 2019-02-19 MED ORDER — DEXTROSE-NACL 5-0.45 % IV SOLN: INTRAVENOUS | Status: AC

## 2019-02-19 MED ORDER — INSULIN REGULAR(HUMAN) IN NACL 100-0.9 UT/100ML-% IV SOLN: INTRAVENOUS | Status: AC

## 2019-02-19 MED ORDER — INSULIN ASPART 100 UNIT/ML ~~LOC~~ SOLN
0.0000 [IU] | Freq: Three times a day (TID) | SUBCUTANEOUS | Status: DC
  Administered 2019-02-19: 17:00:00 11 [IU] via SUBCUTANEOUS
  Administered 2019-02-19: 3 [IU] via SUBCUTANEOUS
  Administered 2019-02-19: 13:00:00 5 [IU] via SUBCUTANEOUS
  Administered 2019-02-20: 09:00:00 15 [IU] via SUBCUTANEOUS
  Administered 2019-02-20: 3 [IU] via SUBCUTANEOUS
  Administered 2019-02-21: 12:00:00 5 [IU] via SUBCUTANEOUS
  Administered 2019-02-21: 09:00:00 15 [IU] via SUBCUTANEOUS
  Administered 2019-02-22: 5 [IU] via SUBCUTANEOUS
  Administered 2019-02-22 – 2019-02-23 (×2): 15 [IU] via SUBCUTANEOUS
  Administered 2019-02-23: 5 [IU] via SUBCUTANEOUS
  Administered 2019-02-23: 11 [IU] via SUBCUTANEOUS
  Administered 2019-02-24: 8 [IU] via SUBCUTANEOUS
  Administered 2019-02-24: 09:00:00 3 [IU] via SUBCUTANEOUS
  Administered 2019-02-25: 08:00:00 15 [IU] via SUBCUTANEOUS

## 2019-02-19 NOTE — Progress Notes (Addendum)
Pt arrived on unit and immediately began saying "I do not feel safe.  See this scar (pointing to his chest) I cut myself and now I want to cut her (pointing to his neck.  RN reassured him that we will keep him safe and that he is in a good place and we will not leave him alone.  RN and NT in room.  Charge RN notified and mid level NP [place] notified immediately.  RN and Agricultural consultant clarified with patient and patient stated "I do want to harm myself."  [place] NP  placed orders for Suicide precautions to be in place and sitter to be in room at all times.  At no time was patient ever left alone.

## 2019-02-19 NOTE — Progress Notes (Signed)
Pt still picking at scars, scratching.  I feel like I'm thinking too much in my head.  Pt asked for broth and something to help him rest.  Ativan given for CIWA 10
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
## 2019-02-19 NOTE — Progress Notes (Signed)
70/30 given to patient.  Will discontinue drip at 0745.

## 2019-02-19 NOTE — Progress Notes (Signed)
IV insulin dc'd.
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
## 2019-02-19 NOTE — Progress Notes (Signed)
Pt's belongings labeled and placed at nurse's desk

## 2019-02-19 NOTE — Progress Notes (Signed)
Inpatient Diabetes Program Recommendations  AACE/ADA: New Consensus Statement on Inpatient Glycemic Control (2015)  Target Ranges:  Prepandial:   less than 140 mg/dL      Peak postprandial:   less than 180 mg/dL (1-2 hours)      Critically ill patients:  140 - 180 mg/dL    Results for Donald Hartman, Donald Hartman (MRN [id number]) as of 02/19/2019 12:31  Ref. Range 02/19/2019 04:25 02/19/2019 05:23 02/19/2019 06:30 02/19/2019 07:24 02/19/2019 12:10  Glucose-Capillary Latest Ref Range: 70 - 99 mg/dL 195 (H)  IV Insulin Drip 225 (H)  IV Insulin Drip +  17 units 70/30 Insulin given at 5:30am 235 (H) 161 (H)  3 units NOVOLOG  243 (H)     Home DM Meds: 70/30 Insulin- 35 units BID       Novolog 0-20 units TID  Current Orders: 70/30 Insulin- 17 units BID      Novolog Moderate Correction Scale/ SSI (0-15 units) TID AC      MD- Note patient transitioned off the IV Insulin drip this AM.  CBG at 12pm today 243 mg/dl.  Patient received 17 units 70/30 insulin this AM for transition.  Takes larger dose 70/30 Insulin at home.  Please consider increasing 70/30 Insulin to 25 units BID with meals (70% total home dose)     --Will follow patient during hospitalization--  Wyn Quaker RN, MSN, CDE Diabetes Coordinator Inpatient Glycemic Control Team Team Pager: [phone] (8a-5p)

## 2019-02-19 NOTE — Progress Notes (Signed)
NP Blount notified of pts BMP results.
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
## 2019-02-20 LAB — BASIC METABOLIC PANEL
Anion gap: 10 (ref 5–15)
BUN: 15 mg/dL (ref 6–20)
CO2: 25 mmol/L (ref 22–32)
Calcium: 8.2 mg/dL — ABNORMAL LOW (ref 8.9–10.3)
Chloride: 100 mmol/L (ref 98–111)
Creatinine, Ser: 0.87 mg/dL (ref 0.61–1.24)
GFR calc Af Amer: >60 mL/min (ref >60)
GFR calc non Af Amer: >60 mL/min (ref >60)
Glucose, Bld: 445 mg/dL — ABNORMAL HIGH (ref 70–99)
Potassium: 3.6 mmol/L (ref 3.5–5.1)
Sodium: 135 mmol/L (ref 135–145)

## 2019-02-20 LAB — GLUCOSE, CAPILLARY
Glucose-Capillary: 414 mg/dL — ABNORMAL HIGH (ref 70–99)
Glucose-Capillary: 99 mg/dL (ref 70–99)
Glucose-Capillary: 191 mg/dL — ABNORMAL HIGH (ref 70–99)
Glucose-Capillary: 67 mg/dL — ABNORMAL LOW (ref 70–99)
Glucose-Capillary: 105 mg/dL — ABNORMAL HIGH (ref 70–99)
Glucose-Capillary: 269 mg/dL — ABNORMAL HIGH (ref 70–99)

## 2019-02-20 LAB — PHOSPHORUS: Phosphorus: 2.8 mg/dL (ref 2.5–4.6)

## 2019-02-20 LAB — MAGNESIUM: Magnesium: 1.9 mg/dL (ref 1.7–2.4)

## 2019-02-20 MED ORDER — INSULIN ASPART PROT & ASPART (70-30 MIX) 100 UNIT/ML ~~LOC~~ SUSP
30.0000 [IU] | Freq: Two times a day (BID) | SUBCUTANEOUS | Status: DC
  Administered 2019-02-20 – 2019-02-21 (×3): 30 [IU] via SUBCUTANEOUS
  Filled 2019-02-20: qty 10

## 2019-02-20 MED ORDER — FENTANYL CITRATE (PF) 100 MCG/2ML IJ SOLN
50.0000 ug | Freq: Once | INTRAMUSCULAR | Status: AC
  Administered 2019-02-20: 22:00:00 50 ug via INTRAVENOUS
  Filled 2019-02-20: qty 2

## 2019-02-20 NOTE — Progress Notes (Signed)
PROGRESS NOTE    Donald Hartman  WUJ:[id number] DOB: [birth year] DOA: 02/18/2019 PCP: Patient, No Pcp Per    Brief Narrative:  HPI: Donald Hartman is a 33 y.o. male with medical history significant of polysubstance abuse, cocaine, heroin, alcohol, type 1 diabetes and bipolar disorder presenting to the emergency room with multiple complaints of nausea vomiting body ache and abdominal pain.  Unfortunate gentleman who recently was traveling from [place] to [place] and got off in [facility], admitted to different hospitals multiple times with drug abuse and DKA, travelled to [place] and now stuck at [place].  Recent admission for DKA, suicidal ideation, abnormal behavior and aggressiveness.  According to the patient, he was discharged from behavioral health last month, he could not afford any prescriptions so he could not take Suboxone and instead went back to doing heroin, last dose being 2 days PTA.  He is living in the streets and is homeless. For about 5 days now he has not been feeling well including nausea, poor appetite, vague abdominal pain about 5 out of 10 in intensity.  Feels very fatigued and weak with pain all over, muscle aches.  He has not taken insulin for about 2 weeks now.  Symptoms are gradual in onset and worsening in severity.  Patient was sitting in the sun all day today has not eaten much, he called EMS.  They found blood sugar 599 and brought to the ER.  Denies any suicidal or homicidal ideation. ED Course: On room air.  Vitals are stable.  Blood sugar 527, creatinine 1.4 and anion gap of 31.  Given 2 L normal saline bolus in the ER and is started on titratable insulin drip. Overnight he said that he feels like he want to cut himself like he did on his right chest. He is on suicide precautions now.    Assessment & Plan:   Principal Problem:   Substance induced mood disorder ([place]) Active Problems:   MDD (major depressive disorder), recurrent episode,  severe (HCC)   Opioid dependence with opioid-induced mood disorder (HCC)   DKA, type 1 ([place])   DKA (diabetic ketoacidoses) ([place])   Diabetic acidosis with type 1 diabetes: Anion gap closed.  Noncompliance.  Electrolytes improved.  Resumed on long-acting insulin as well as sliding scale insulin.  Allow low carbohydrate diet.  Hypophosphatemia: Replaced with improvement.   Acute kidney injury: Improved.  Major depressive disorder recurrent episode with suicidal ideation: Continue one-to-one Actuary. Seen by psych. Suggest admission to inpatient psychiatry unit.  Patient on multiple antipsychotics that he will continue. Social worker to find inpatient psych.  Opiate dependence with opiate-induced mood disorder: Intermittently on Suboxone.  Now resumed.    Medically stable to transfer to inpatient psych  Continue all suicide precautions and one-to-one sitter. He is volunteering to go to Psych. Will IVC if tries to leave only.    DVT prophylaxis: Subcu Lovenox Code Status: Full code Family Communication: None Disposition Plan: Inpatient psych, medically cleared to transfer if bed available.   Consultants:   None  Procedures:   None  Antimicrobials:   None   Subjective: Patient seen and examined.  No overnight events.  Remains on one-to-one sitter.  Patient states that he can cannot clear his head off suicidal ideations.  Blood sugars remain elevated. Objective: Vitals:   02/19/19 1400 02/19/19 1602 02/19/19 2102 02/20/19 0542  BP: 118/86 117/89 133/80 128/88  Pulse: 89 76 79 66  Resp: 15 16 17 17   Temp:  98.5 F (36.9 C) 98.1 F (36.7 C) 97.7 F (36.5 C)  TempSrc:  Oral Oral Oral  SpO2: 99% 99% 100% 93%  Weight:      Height:        Intake/Output Summary (Last 24 hours) at 02/20/2019 1128 Last data filed at 02/20/2019 [id number] Gross per 24 hour  Intake 1915.91 ml  Output -  Net 1915.91 ml   Filed Weights   02/18/19 1534 02/18/19 2058  Weight: 78.9 kg 72.6 kg     Examination:  General exam: Appears calm and comfortable,  Respiratory system: Clear to auscultation. Respiratory effort normal. Surgical scar right chest healed and dry.  Cardiovascular system: S1 & S2 heard, RRR. No JVD, murmurs, rubs, gallops or clicks. No pedal edema. Gastrointestinal system: Abdomen is nondistended, soft and nontender. No organomegaly or masses felt. Normal bowel sounds heard. Central nervous system: Alert and oriented. No focal neurological deficits. Extremities: Symmetric 5 x 5 power. Skin: No rashes, lesions or ulcers Psychiatry: Judgement and insight appear impaired.   Mood & affect flat.  No delusions or hallucinations.  He explains of thought of harming himself or cutting himself.      Data Reviewed: I have personally reviewed following labs and imaging studies  CBC: Recent Labs  Lab 02/18/19 1607 02/19/19 0307  WBC 9.1 7.8  NEUTROABS 6.6  --   HGB 14.4 11.5*  HCT 44.3 35.6*  MCV 89.0 89.2  PLT 279 184   Basic Metabolic Panel: Recent Labs  Lab 02/18/19 1607 02/18/19 2121 02/19/19 0307 02/20/19 0730  NA 133* 137 133* 135  K 5.0 4.3 3.9 3.6  CL 92* 102 106 100  CO2 10* 13* 18* 25  GLUCOSE 527* 271* 141* 445*  BUN 23* 22* 20 15  CREATININE 1.41* 1.32* 0.88 0.87  CALCIUM 9.0 8.2* 7.7* 8.2*  MG  --  2.4 2.1 1.9  PHOS  --  2.9 1.8* 2.8   GFR: Estimated Creatinine Clearance: 120.8 mL/min (by C-G formula based on SCr of 0.87 mg/dL). Liver Function Tests: Recent Labs  Lab 02/18/19 1623  AST 84*  ALT 104*  ALKPHOS 87  BILITOT 1.5*  PROT 8.3*  ALBUMIN 4.7   Recent Labs  Lab 02/18/19 1623  LIPASE 32   No results for input(s): AMMONIA in the last 168 hours. Coagulation Profile: No results for input(s): INR, PROTIME in the last 168 hours. Cardiac Enzymes: No results for input(s): CKTOTAL, CKMB, CKMBINDEX, TROPONINI in the last 168 hours. BNP (last 3 results) No results for input(s): PROBNP in the last 8760 hours. HbA1C: No results  for input(s): HGBA1C in the last 72 hours. CBG: Recent Labs  Lab 02/19/19 1210 02/19/19 1553 02/19/19 2105 02/20/19 0728 02/20/19 1119  GLUCAP 243* 303* 239* 414* 99   Lipid Profile: No results for input(s): CHOL, HDL, LDLCALC, TRIG, CHOLHDL, LDLDIRECT in the last 72 hours. Thyroid Function Tests: No results for input(s): TSH, T4TOTAL, FREET4, T3FREE, THYROIDAB in the last 72 hours. Anemia Panel: No results for input(s): VITAMINB12, FOLATE, FERRITIN, TIBC, IRON, RETICCTPCT in the last 72 hours. Sepsis Labs: No results for input(s): PROCALCITON, LATICACIDVEN in the last 168 hours.  Recent Results (from the past 240 hour(s))  SARS Coronavirus 2 (CEPHEID - Performed in [facility] hospital lab), Hosp Order     Status: None   Collection Time: 02/18/19  7:54 PM   Specimen: Nasopharyngeal Swab  Result Value Ref Range Status   SARS Coronavirus 2 NEGATIVE NEGATIVE Final    Comment: (NOTE) If  result is NEGATIVE SARS-CoV-2 target nucleic acids are NOT DETECTED. The SARS-CoV-2 RNA is generally detectable in upper and lower  respiratory specimens during the acute phase of infection. The lowest  concentration of SARS-CoV-2 viral copies this assay can detect is 250  copies / mL. A negative result does not preclude SARS-CoV-2 infection  and should not be used as the sole basis for treatment or other  patient management decisions.  A negative result may occur with  improper specimen collection / handling, submission of specimen other  than nasopharyngeal swab, presence of viral mutation(s) within the  areas targeted by this assay, and inadequate number of viral copies  (<250 copies / mL). A negative result must be combined with clinical  observations, patient history, and epidemiological information. If result is POSITIVE SARS-CoV-2 target nucleic acids are DETECTED. The SARS-CoV-2 RNA is generally detectable in upper and lower  respiratory specimens dur ing the acute phase of infection.   Positive  results are indicative of active infection with SARS-CoV-2.  Clinical  correlation with patient history and other diagnostic information is  necessary to determine patient infection status.  Positive results do  not rule out bacterial infection or co-infection with other viruses. If result is PRESUMPTIVE POSTIVE SARS-CoV-2 nucleic acids MAY BE PRESENT.   A presumptive positive result was obtained on the submitted specimen  and confirmed on repeat testing.  While 2019 novel coronavirus  (SARS-CoV-2) nucleic acids may be present in the submitted sample  additional confirmatory testing may be necessary for epidemiological  and / or clinical management purposes  to differentiate between  SARS-CoV-2 and other Sarbecovirus currently known to infect humans.  If clinically indicated additional testing with an alternate test  methodology [phone]([id number]) is advised. The SARS-CoV-2 RNA is generally  detectable in upper and lower respiratory sp ecimens during the acute  phase of infection. The expected result is Negative. Fact Sheet for Patients:  BoilerBrush.com.cyhttps://www.fda.gov/media/136312/download Fact Sheet for Healthcare Providers: https://pope.com/https://www.fda.gov/media/136313/download This test is not yet approved or cleared by the [place] FDA and has been authorized for detection and/or diagnosis of SARS-CoV-2 by FDA under an Emergency Use Authorization (EUA).  This EUA will remain in effect (meaning this test can be used) for the duration of the COVID-19 declaration under Section 564(b)(1) of the Act, 21 U.S.C. section 360bbb-3(b)(1), unless the authorization is terminated or revoked sooner. Performed at [facility], [address] [place], [place] [address]   MRSA PCR Screening     Status: None   Collection Time: 02/18/19  9:19 PM   Specimen: Nasal Mucosa; Nasopharyngeal  Result Value Ref Range Status   MRSA by PCR NEGATIVE NEGATIVE Final    Comment:        The GeneXpert MRSA  Assay (FDA approved for NASAL specimens only), is one component of a comprehensive MRSA colonization surveillance program. It is not intended to diagnose MRSA infection nor to guide or monitor treatment for MRSA infections. Performed at [facility], [address] [place], [place] [address]          Radiology Studies: No results found.      Scheduled Meds: . buprenorphine-naloxone  1 tablet Sublingual BID  . buPROPion  300 mg Oral Daily  . Chlorhexidine Gluconate Cloth  6 each Topical Daily  . enoxaparin (LOVENOX) injection  40 mg Subcutaneous Q24H  . hydrocortisone cream   Topical BID  . insulin aspart  0-15 Units Subcutaneous TID WC  . insulin aspart protamine- aspart  30 Units Subcutaneous BID WC  . phosphorus  250 mg Oral TID  . QUEtiapine  400 mg Oral QHS  . sertraline  100 mg Oral Daily   Continuous Infusions:    LOS: 2 days    Time spent: 25 minutes    Dorcas CarrowKuber Elihu Milstein, MD [facility] Pager [phone]  If 7PM-7AM, please contact night-coverage [email or web address] Password [email or web address] 02/20/2019, 11:28 AM

## 2019-02-20 NOTE — TOC Initial Note (Signed)
Transition of Care [facility]) - Initial/Assessment Note    Patient Details  Name: Donald Hartman MRN: [id number] Date of Birth: 04/07/1986  Transition of Care [facility]) CM/SW Contact:    Elliot Gurney [place], [place] Phone Number: 02/20/2019, 4:17 PM  Clinical Narrative:                 EPP:IRJJOA Martinezis a 33 y.o.malewith medical history significant ofpolysubstance abuse, cocaine, heroin, alcohol, type 1 diabetes and bipolar disorder presenting to the emergency room with multiple complaints of nausea vomiting body ache and abdominal pain. Patient is currently homeless.  Patient had a recent behavioral health stay from 01/31/19-02/04/19. Patient continues to endorse SI and agrees with transfer to a psychiatric hospital.  Phone call place to the [place] with Waunita Schooner, patient declined as he did not meet their criteria. Patient's blood sugar must be 350 for 24 hours.  Spoke with Aniceto Boss at [facility], she had no beds today. If patient met the intake criteria, patient would have to be transferred tomorrow. She will call back when decision regarding bed offer is made. Patient's information faxed to multiple facilities, no bed offers at this time.  Expected Discharge Plan: Psychiatric Hospital Barriers to Discharge: Homeless with medical needs, Inadequate or no insurance, Psych Bed not available   Patient Goals and CMS Choice Patient states their goals for this hospitalization and ongoing recovery are:: Patient confirms that he is activley suicidal and agrees with plan to transfer to a psychiatric hospital   Choice offered to / list presented to : NA  Expected Discharge Plan and Services Expected Discharge Plan: [facility] In-house Referral: Clinical Social Work Discharge Planning Services: NA   Living arrangements for the past 2 months: No permanent address, Homeless Expected Discharge Date: (unknown)                                     Prior Living Arrangements/Services Living arrangements for the past 2 months: No permanent address, Homeless Lives with:: Self Patient language and need for interpreter reviewed:: No Do you feel safe going back to the place where you live?: No   patient continues to have thoughts of self harm    Care giver support system in place?: No (comment) Current home services: Other (comment)(none) Criminal Activity/Legal Involvement Pertinent to Current Situation/Hospitalization: No - Comment as needed  Activities of Daily Living Home Assistive Devices/Equipment: None ADL Screening (condition at time of admission) Patient's cognitive ability adequate to safely complete daily activities?: Yes Is the patient deaf or have difficulty hearing?: No Does the patient have difficulty seeing, even when wearing glasses/contacts?: No Does the patient have difficulty concentrating, remembering, or making decisions?: No Patient able to express need for assistance with ADLs?: Yes Does the patient have difficulty dressing or bathing?: No Independently performs ADLs?: Yes (appropriate for developmental age) Does the patient have difficulty walking or climbing stairs?: Yes Weakness of Legs: Both Weakness of Arms/Hands: Both  Permission Sought/Granted                  Emotional Assessment Appearance:: Appears stated age, Disheveled Attitude/Demeanor/Rapport: Engaged Affect (typically observed): Anxious Orientation: : Oriented to Situation, Oriented to  Time, Oriented to Place, Oriented to Self Alcohol / Substance Use: Illicit Drugs Psych Involvement: Yes (comment)  Admission diagnosis:  Diabetic ketoacidosis without coma associated with type 1 diabetes mellitus ([place]) [E10.10] Patient Active Problem List   Diagnosis  Date Noted  . Substance induced mood disorder ([place])   . DKA, type 1 ([place]) 02/18/2019  . DKA (diabetic ketoacidoses) ([place]) 02/18/2019  . Opioid dependence with opioid-induced mood  disorder ([place])   . MDD (major depressive disorder), recurrent episode, severe ([place]) 01/31/2019   PCP:  Patient, No Pcp Per Pharmacy:   [facility] [place] [place], [place] [address] Phone: [phone] Fax: [phone]  [place], [place] [address] [place] [address] Phone: [phone] Fax: [phone]     Social Determinants of Health (SDOH) Interventions    Readmission Risk Interventions No flowsheet data found.

## 2019-02-21 LAB — BASIC METABOLIC PANEL
Anion gap: 8 (ref 5–15)
BUN: 11 mg/dL (ref 6–20)
CO2: 29 mmol/L (ref 22–32)
Calcium: 8.4 mg/dL — ABNORMAL LOW (ref 8.9–10.3)
Chloride: 101 mmol/L (ref 98–111)
Creatinine, Ser: 0.79 mg/dL (ref 0.61–1.24)
GFR calc Af Amer: >60 mL/min (ref >60)
GFR calc non Af Amer: >60 mL/min (ref >60)
Glucose, Bld: 354 mg/dL — ABNORMAL HIGH (ref 70–99)
Potassium: 3.3 mmol/L — ABNORMAL LOW (ref 3.5–5.1)
Sodium: 138 mmol/L (ref 135–145)

## 2019-02-21 LAB — GLUCOSE, CAPILLARY
Glucose-Capillary: 384 mg/dL — ABNORMAL HIGH (ref 70–99)
Glucose-Capillary: 209 mg/dL — ABNORMAL HIGH (ref 70–99)
Glucose-Capillary: 114 mg/dL — ABNORMAL HIGH (ref 70–99)
Glucose-Capillary: 221 mg/dL — ABNORMAL HIGH (ref 70–99)
Glucose-Capillary: 244 mg/dL — ABNORMAL HIGH (ref 70–99)

## 2019-02-21 MED ORDER — INSULIN ASPART PROT & ASPART (70-30 MIX) 100 UNIT/ML ~~LOC~~ SUSP
35.0000 [IU] | Freq: Two times a day (BID) | SUBCUTANEOUS | Status: DC
  Administered 2019-02-21 – 2019-02-23 (×4): 35 [IU] via SUBCUTANEOUS
  Filled 2019-02-21: qty 10

## 2019-02-21 MED ORDER — ARIPIPRAZOLE 5 MG PO TABS
5.0000 mg | ORAL_TABLET | Freq: Every day | ORAL | Status: DC
  Administered 2019-02-21 – 2019-02-26 (×6): 5 mg via ORAL
  Filled 2019-02-21 (×6): qty 1

## 2019-02-21 MED ORDER — POTASSIUM CHLORIDE CRYS ER 20 MEQ PO TBCR
40.0000 meq | EXTENDED_RELEASE_TABLET | Freq: Two times a day (BID) | ORAL | Status: AC
  Administered 2019-02-21 – 2019-02-22 (×4): 40 meq via ORAL
  Filled 2019-02-21 (×4): qty 2

## 2019-02-21 MED ORDER — HYDROCODONE-ACETAMINOPHEN 5-325 MG PO TABS
2.0000 | ORAL_TABLET | Freq: Once | ORAL | Status: AC
  Administered 2019-02-21: 2 via ORAL
  Filled 2019-02-21: qty 2

## 2019-02-21 NOTE — Progress Notes (Signed)
PROGRESS NOTE    Donald HenleDaniel Hartman  BJY:[id number] DOB: [birth year] DOA: 02/18/2019 PCP: Patient, No Pcp Per    Brief Narrative:  HPI: Donald Hartman is a 33 y.o. male with medical history significant of polysubstance abuse, cocaine, heroin, alcohol, type 1 diabetes and bipolar disorder presenting to the emergency room with multiple complaints of nausea vomiting body ache and abdominal pain.  Unfortunate gentleman who recently was [place] and [place], admitted to different hospitals multiple times with drug abuse and DKA, [place] and [place].  Recent admission for DKA, suicidal ideation, abnormal behavior and aggressiveness.  According to the patient, he was discharged from behavioral health last month, he could not afford any prescriptions so he could not take Suboxone and instead went back to doing heroin, last dose being 2 days PTA.  He is living in the streets and is homeless. For about 5 days now he has not been feeling well including nausea, poor appetite, vague abdominal pain about 5 out of 10 in intensity.  Feels very fatigued and weak with pain all over, muscle aches.  He has not taken insulin for about 2 weeks now.  Symptoms are gradual in onset and worsening in severity.  Patient was sitting in the sun all day today has not eaten much, he called EMS.  They found blood sugar 599 and brought to the ER.  ED Course: On room air.  Vitals are stable.  Blood sugar 527, creatinine 1.4 and anion gap of 31.  Given 2 L normal saline bolus in the ER and is started on titratable insulin drip. After hospitalization he said that he feels like he want to cut himself like he did on his right chest. He is on suicide precautions now.    Assessment & Plan:   Principal Problem:   Substance induced mood disorder (HCC) Active Problems:   MDD (major depressive disorder), recurrent episode, severe (HCC)   Opioid dependence  with opioid-induced mood disorder (HCC)   DKA, type 1 (HCC)   DKA (diabetic ketoacidoses) (HCC)   Diabetic acidosis with type 1 diabetes: Anion gap closed.  Noncompliance.  Electrolytes improved.  Resumed on long-acting insulin as well as sliding scale insulin.  Allow low carbohydrate diet. Increase dose of 70/30 insulin to 35 twice a day today.  Blood sugars are fairly controlled.  This is his optimum control.  Hypophosphatemia: Replaced with improvement.   Acute kidney injury: Improved.  Major depressive disorder recurrent episode with suicidal ideation: Continue one-to-one Comptrollersitter. Seen by psych. Suggest admission to inpatient psychiatry unit.  Patient on multiple antipsychotics that he will continue. Social worker to find inpatient psych.  Opiate dependence with opiate-induced mood disorder: Intermittently on Suboxone.  Now resumed.    Medically stable to transfer to inpatient psych  Continue all suicide precautions and one-to-one sitter. He is volunteering to go to Psych. Will IVC if tries to leave only.    DVT prophylaxis: Subcu Lovenox Code Status: Full code Family Communication: None Disposition Plan: Inpatient psych, medically cleared to transfer if bed available.   Consultants:   None  Procedures:   None  Antimicrobials:   None   Subjective: No overnight events.  More alert and interactive.  Blood sugars are controlled as anticipated. "I feel like going near a glass door, finding a piece of glass and cutting my throat"  Objective: Vitals:   02/20/19 1345 02/20/19 1940 02/21/19 0025 02/21/19 0409  BP: 121/84 125/81 104/61 133/82  Pulse: 82 91 69 76  Resp: 16 18 17 16   Temp: 98.2 F (36.8 C) 98.3 F (36.8 C) (!) 97.5 F (36.4 C) 97.8 F (36.6 C)  TempSrc: Oral Oral Axillary Oral  SpO2: 99% 97% 100% 100%  Weight:      Height:        Intake/Output Summary (Last 24 hours) at 02/21/2019 1142 Last data filed at 02/21/2019 1017 Gross per 24 hour  Intake  3120 ml  Output -  Net 3120 ml   Filed Weights   02/18/19 1534 02/18/19 2058  Weight: 78.9 kg 72.6 kg    Examination:  General exam: Appears calm and comfortable,  Respiratory system: Clear to auscultation. Respiratory effort normal. Surgical scar right chest healed and dry.  Cardiovascular system: S1 & S2 heard, RRR. No JVD, murmurs, rubs, gallops or clicks. No pedal edema. Gastrointestinal system: Abdomen is nondistended, soft and nontender. No organomegaly or masses felt. Normal bowel sounds heard. Central nervous system: Alert and oriented. No focal neurological deficits. Extremities: Symmetric 5 x 5 power. Skin: No rashes, lesions or ulcers Psychiatry: Judgement and insight appear impaired.  Mood & affect flat.  No delusions or hallucinations.  He explains of thought of harming himself or cutting himself.      Data Reviewed: I have personally reviewed following labs and imaging studies  CBC: Recent Labs  Lab 02/18/19 1607 02/19/19 0307  WBC 9.1 7.8  NEUTROABS 6.6  --   HGB 14.4 11.5*  HCT 44.3 35.6*  MCV 89.0 89.2  PLT 279 563   Basic Metabolic Panel: Recent Labs  Lab 02/18/19 1607 02/18/19 2121 02/19/19 0307 02/20/19 0730 02/21/19 0608  NA 133* 137 133* 135 138  K 5.0 4.3 3.9 3.6 3.3*  CL 92* 102 106 100 101  CO2 10* 13* 18* 25 29  GLUCOSE 527* 271* 141* 445* 354*  BUN 23* 22* 20 15 11   CREATININE 1.41* 1.32* 0.88 0.87 0.79  CALCIUM 9.0 8.2* 7.7* 8.2* 8.4*  MG  --  2.4 2.1 1.9  --   PHOS  --  2.9 1.8* 2.8  --    GFR: Estimated Creatinine Clearance: 131.3 mL/min (by C-G formula based on SCr of 0.79 mg/dL). Liver Function Tests: Recent Labs  Lab 02/18/19 1623  AST 84*  ALT 104*  ALKPHOS 87  BILITOT 1.5*  PROT 8.3*  ALBUMIN 4.7   Recent Labs  Lab 02/18/19 1623  LIPASE 32   No results for input(s): AMMONIA in the last 168 hours. Coagulation Profile: No results for input(s): INR, PROTIME in the last 168 hours. Cardiac Enzymes: No results  for input(s): CKTOTAL, CKMB, CKMBINDEX, TROPONINI in the last 168 hours. BNP (last 3 results) No results for input(s): PROBNP in the last 8760 hours. HbA1C: No results for input(s): HGBA1C in the last 72 hours. CBG: Recent Labs  Lab 02/20/19 1904 02/20/19 1937 02/20/19 2054 02/21/19 0738 02/21/19 1126  GLUCAP 67* 105* 269* 384* 209*   Lipid Profile: No results for input(s): CHOL, HDL, LDLCALC, TRIG, CHOLHDL, LDLDIRECT in the last 72 hours. Thyroid Function Tests: No results for input(s): TSH, T4TOTAL, FREET4, T3FREE, THYROIDAB in the last 72 hours. Anemia Panel: No results for input(s): VITAMINB12, FOLATE, FERRITIN, TIBC, IRON, RETICCTPCT in the last 72 hours. Sepsis Labs: No results for input(s): PROCALCITON, LATICACIDVEN in the last 168 hours.  Recent Results (from the past 240 hour(s))  SARS Coronavirus 2 (CEPHEID - Performed in [place] hospital lab), [facility]  Status: None   Collection Time: 02/18/19  7:54 PM   Specimen: Nasopharyngeal Swab  Result Value Ref Range Status   SARS Coronavirus 2 NEGATIVE NEGATIVE Final    Comment: (NOTE) If result is NEGATIVE SARS-CoV-2 target nucleic acids are NOT DETECTED. The SARS-CoV-2 RNA is generally detectable in upper and lower  respiratory specimens during the acute phase of infection. The lowest  concentration of SARS-CoV-2 viral copies this assay can detect is 250  copies / mL. A negative result does not preclude SARS-CoV-2 infection  and should not be used as the sole basis for treatment or other  patient management decisions.  A negative result may occur with  improper specimen collection / handling, submission of specimen other  than nasopharyngeal swab, presence of viral mutation(s) within the  areas targeted by this assay, and inadequate number of viral copies  (<250 copies / mL). A negative result must be combined with clinical  observations, patient history, and epidemiological information. If result is POSITIVE  SARS-CoV-2 target nucleic acids are DETECTED. The SARS-CoV-2 RNA is generally detectable in upper and lower  respiratory specimens dur ing the acute phase of infection.  Positive  results are indicative of active infection with SARS-CoV-2.  Clinical  correlation with patient history and other diagnostic information is  necessary to determine patient infection status.  Positive results do  not rule out bacterial infection or co-infection with other viruses. If result is PRESUMPTIVE POSTIVE SARS-CoV-2 nucleic acids MAY BE PRESENT.   A presumptive positive result was obtained on the submitted specimen  and confirmed on repeat testing.  While 2019 novel coronavirus  (SARS-CoV-2) nucleic acids may be present in the submitted sample  additional confirmatory testing may be necessary for epidemiological  and / or clinical management purposes  to differentiate between  SARS-CoV-2 and other Sarbecovirus currently known to infect humans.  If clinically indicated additional testing with an alternate test  methodology [phone]([id number]) is advised. The SARS-CoV-2 RNA is generally  detectable in upper and lower respiratory sp ecimens during the acute  phase of infection. The expected result is Negative. Fact Sheet for Patients:  BoilerBrush.com.cyhttps://www.fda.gov/media/136312/download Fact Sheet for Healthcare Providers: https://pope.com/https://www.fda.gov/media/136313/download This test is not yet approved or cleared by the [place] FDA and has been authorized for detection and/or diagnosis of SARS-CoV-2 by FDA under an Emergency Use Authorization (EUA).  This EUA will remain in effect (meaning this test can be used) for the duration of the COVID-19 declaration under Section 564(b)(1) of the Act, 21 U.S.C. section 360bbb-3(b)(1), unless the authorization is terminated or revoked sooner. Performed at [facility], [address] [place], [place] [address]   MRSA PCR Screening     Status: None   Collection  Time: 02/18/19  9:19 PM   Specimen: Nasal Mucosa; Nasopharyngeal  Result Value Ref Range Status   MRSA by PCR NEGATIVE NEGATIVE Final    Comment:        The GeneXpert MRSA Assay (FDA approved for NASAL specimens only), is one component of a comprehensive MRSA colonization surveillance program. It is not intended to diagnose MRSA infection nor to guide or monitor treatment for MRSA infections. Performed at [facility], [address] [place], [place] [address]          Radiology Studies: No results found.      Scheduled Meds: . buprenorphine-naloxone  1 tablet Sublingual BID  . buPROPion  300 mg Oral Daily  . Chlorhexidine Gluconate Cloth  6 each Topical Daily  .  enoxaparin (LOVENOX) injection  40 mg Subcutaneous Q24H  . hydrocortisone cream   Topical BID  . insulin aspart  0-15 Units Subcutaneous TID WC  . insulin aspart protamine- aspart  35 Units Subcutaneous BID WC  . phosphorus  250 mg Oral TID  . potassium chloride  40 mEq Oral BID  . QUEtiapine  400 mg Oral QHS  . sertraline  100 mg Oral Daily   Continuous Infusions:    LOS: 3 days    Time spent: 25 minutes    Dorcas CarrowKuber Wally Shevchenko, MD [facility] Pager [phone]  If 7PM-7AM, please contact night-coverage [email or web address] Password [email or web address] 02/21/2019, 11:42 AM

## 2019-02-22 LAB — BASIC METABOLIC PANEL
Anion gap: 9 (ref 5–15)
BUN: 9 mg/dL (ref 6–20)
CO2: 29 mmol/L (ref 22–32)
Calcium: 8.7 mg/dL — ABNORMAL LOW (ref 8.9–10.3)
Chloride: 101 mmol/L (ref 98–111)
Creatinine, Ser: 0.82 mg/dL (ref 0.61–1.24)
GFR calc Af Amer: >60 mL/min (ref >60)
GFR calc non Af Amer: >60 mL/min (ref >60)
Glucose, Bld: 242 mg/dL — ABNORMAL HIGH (ref 70–99)
Potassium: 3.8 mmol/L (ref 3.5–5.1)
Sodium: 139 mmol/L (ref 135–145)

## 2019-02-22 LAB — GLUCOSE, CAPILLARY
Glucose-Capillary: 242 mg/dL — ABNORMAL HIGH (ref 70–99)
Glucose-Capillary: 84 mg/dL (ref 70–99)
Glucose-Capillary: 354 mg/dL — ABNORMAL HIGH (ref 70–99)
Glucose-Capillary: 127 mg/dL — ABNORMAL HIGH (ref 70–99)

## 2019-02-22 NOTE — TOC Progression Note (Signed)
Transition of Care [facility]) - Progression Note    Patient Details  Name: Donald Hartman MRN: [id number] Date of Birth: Jul 30, 1986  [facility] CM/SW Contact  Servando Snare, LCSW Phone Number: 02/22/2019, 9:59 AM  Clinical Narrative:   Patient under review at [facility]. Do not anticipate beds today.   LCSW faxed patient out to:  [facility]  Patient lack of insurance and homeless status may be a barrier to placement.     Expected Discharge Plan: Psychiatric Hospital Barriers to Discharge: Homeless with medical needs, Inadequate or no insurance, Psych Bed not available  Expected Discharge Plan and Services Expected Discharge Plan: Psychiatric Hospital In-house Referral: Clinical Social Work Discharge Planning Services: NA   Living arrangements for the past 2 months: No permanent address, Homeless Expected Discharge Date: (unknown)                                     Social Determinants of Health (SDOH) Interventions    Readmission Risk Interventions No flowsheet data found.

## 2019-02-22 NOTE — Progress Notes (Signed)
PROGRESS NOTE    Donald Hartman  KGM:[id number] DOB: 1985-12-28 DOA: 02/18/2019 PCP: Patient, No Pcp Per    Brief Narrative:  HPI: Donald Hartman is a 33 y.o. male with medical history significant of polysubstance abuse, cocaine, heroin, alcohol, type 1 diabetes and bipolar disorder presenting to the emergency room with multiple complaints of nausea vomiting body ache and abdominal pain.  Unfortunate gentleman who recently was traveling from [place] to [place] and [facility], admitted to different hospitals multiple times with drug abuse and DKA, travelled to [place] and now stuck at [place].  Recent admission for DKA, suicidal ideation, abnormal behavior and aggressiveness.  According to the patient, he was discharged from behavioral health last month, he could not afford any prescriptions so he could not take Suboxone and instead went back to doing heroin, last dose being 2 days PTA.  He is living in the streets and is homeless. For about 5 days now he has not been feeling well including nausea, poor appetite, vague abdominal pain about 5 out of 10 in intensity.  Feels very fatigued and weak with pain all over, muscle aches.  He has not taken insulin for about 2 weeks now.  Symptoms are gradual in onset and worsening in severity.  Patient was sitting in the sun all day today has not eaten much, he called EMS.  They found blood sugar 599 and brought to the ER.  ED Course: On room air.  Vitals are stable.  Blood sugar 527, creatinine 1.4 and anion gap of 31.  Given 2 L normal saline bolus in the ER and is started on titratable insulin drip. After hospitalization he said that he feels like he want to cut himself like he did on his right chest. He is on suicide precautions now.    Assessment & Plan:   Principal Problem:   Substance induced mood disorder (HCC) Active Problems:   MDD (major depressive disorder), recurrent episode, severe (HCC)   Opioid dependence  with opioid-induced mood disorder (HCC)   DKA, type 1 (HCC)   DKA (diabetic ketoacidoses) (HCC)   Diabetic acidosis with type 1 diabetes: Anion gap closed.  Noncompliance.  Electrolytes improved.  Resumed on long-acting insulin as well as sliding scale insulin.  Allow low carbohydrate diet. Increase dose of 70/30 insulin to 35 twice day.  Blood sugars are fairly controlled.  This is his optimum control.  Hypophosphatemia: Replaced with improvement.   Acute kidney injury: Improved.  Major depressive disorder recurrent episode with suicidal ideation: Continue one-to-one Comptrollersitter. Seen by psych. Suggest admission to inpatient psychiatry unit.  Patient on multiple antipsychotics that he will continue. Social worker to find inpatient psych.  Opiate dependence with opiate-induced mood disorder: Intermittently on Suboxone.  Now resumed.    Medically stable to transfer to inpatient psych  Continue all suicide precautions and one-to-one sitter. He is volunteering to go to Psych. Will IVC if tries to leave only.    DVT prophylaxis: Subcu Lovenox Code Status: Full code Family Communication: None Disposition Plan: Inpatient psych, medically cleared to transfer if bed available.   Consultants:   None  Procedures:   None  Antimicrobials:   None   Subjective: No overnight events.  More alert and interactive.  Blood sugars are controlled as anticipated. He says he has a pair scissors but gave it to Nurse as he was having thoughts to stab himself.  Objective: Vitals:   02/21/19 1807 02/21/19 2100 02/22/19 0013 02/22/19 0440  BP: (!) 129/91 (!) 139/98 (!) 134/93 128/80  Pulse: 86 85 84 91  Resp: 18 18 18 15   Temp: 98 F (36.7 C) 98 F (36.7 C) 98.3 F (36.8 C) 98.1 F (36.7 C)  TempSrc:  Oral Oral Oral  SpO2: 98% 100% 100% 100%  Weight:      Height:        Intake/Output Summary (Last 24 hours) at 02/22/2019 1212 Last data filed at 02/22/2019 1100 Gross per 24 hour  Intake  2060 ml  Output -  Net 2060 ml   Filed Weights   02/18/19 1534 02/18/19 2058  Weight: 78.9 kg 72.6 kg    Examination:  General exam: Appears calm and comfortable,  Respiratory system: Clear to auscultation. Respiratory effort normal. Surgical scar right chest healed and dry.  Cardiovascular system: S1 & S2 heard, RRR. No JVD, murmurs, rubs, gallops or clicks. No pedal edema. Gastrointestinal system: Abdomen is nondistended, soft and nontender. No organomegaly or masses felt. Normal bowel sounds heard. Central nervous system: Alert and oriented. No focal neurological deficits. Extremities: Symmetric 5 x 5 power. Skin: No rashes, lesions or ulcers Psychiatry: Judgement and insight appear impaired.  Mood & affect flat.  No delusions or hallucinations.  He explains of thought of harming himself or cutting himself.      Data Reviewed: I have personally reviewed following labs and imaging studies  CBC: Recent Labs  Lab 02/18/19 1607 02/19/19 0307  WBC 9.1 7.8  NEUTROABS 6.6  --   HGB 14.4 11.5*  HCT 44.3 35.6*  MCV 89.0 89.2  PLT 279 184   Basic Metabolic Panel: Recent Labs  Lab 02/18/19 2121 02/19/19 0307 02/20/19 0730 02/21/19 0608 02/22/19 0705  NA 137 133* 135 138 139  K 4.3 3.9 3.6 3.3* 3.8  CL 102 106 100 101 101  CO2 13* 18* 25 29 29   GLUCOSE 271* 141* 445* 354* 242*  BUN 22* 20 15 11 9   CREATININE 1.32* 0.88 0.87 0.79 0.82  CALCIUM 8.2* 7.7* 8.2* 8.4* 8.7*  MG 2.4 2.1 1.9  --   --   PHOS 2.9 1.8* 2.8  --   --    GFR: Estimated Creatinine Clearance: 128.1 mL/min (by C-G formula based on SCr of 0.82 mg/dL). Liver Function Tests: Recent Labs  Lab 02/18/19 1623  AST 84*  ALT 104*  ALKPHOS 87  BILITOT 1.5*  PROT 8.3*  ALBUMIN 4.7   Recent Labs  Lab 02/18/19 1623  LIPASE 32   No results for input(s): AMMONIA in the last 168 hours. Coagulation Profile: No results for input(s): INR, PROTIME in the last 168 hours. Cardiac Enzymes: No results for  input(s): CKTOTAL, CKMB, CKMBINDEX, TROPONINI in the last 168 hours. BNP (last 3 results) No results for input(s): PROBNP in the last 8760 hours. HbA1C: No results for input(s): HGBA1C in the last 72 hours. CBG: Recent Labs  Lab 02/21/19 1636 02/21/19 2213 02/21/19 2329 02/22/19 0751 02/22/19 1132  GLUCAP 114* 221* 244* 242* 84   Lipid Profile: No results for input(s): CHOL, HDL, LDLCALC, TRIG, CHOLHDL, LDLDIRECT in the last 72 hours. Thyroid Function Tests: No results for input(s): TSH, T4TOTAL, FREET4, T3FREE, THYROIDAB in the last 72 hours. Anemia Panel: No results for input(s): VITAMINB12, FOLATE, FERRITIN, TIBC, IRON, RETICCTPCT in the last 72 hours. Sepsis Labs: No results for input(s): PROCALCITON, LATICACIDVEN in the last 168 hours.  Recent Results (from the past 240 hour(s))  SARS Coronavirus 2 (CEPHEID - Performed in [facility] hospital lab),  Hosp Order     Status: None   Collection Time: 02/18/19  7:54 PM   Specimen: Nasopharyngeal Swab  Result Value Ref Range Status   SARS Coronavirus 2 NEGATIVE NEGATIVE Final    Comment: (NOTE) If result is NEGATIVE SARS-CoV-2 target nucleic acids are NOT DETECTED. The SARS-CoV-2 RNA is generally detectable in upper and lower  respiratory specimens during the acute phase of infection. The lowest  concentration of SARS-CoV-2 viral copies this assay can detect is 250  copies / mL. A negative result does not preclude SARS-CoV-2 infection  and should not be used as the sole basis for treatment or other  patient management decisions.  A negative result may occur with  improper specimen collection / handling, submission of specimen other  than nasopharyngeal swab, presence of viral mutation(s) within the  areas targeted by this assay, and inadequate number of viral copies  (<250 copies / mL). A negative result must be combined with clinical  observations, patient history, and epidemiological information. If result is POSITIVE  SARS-CoV-2 target nucleic acids are DETECTED. The SARS-CoV-2 RNA is generally detectable in upper and lower  respiratory specimens dur ing the acute phase of infection.  Positive  results are indicative of active infection with SARS-CoV-2.  Clinical  correlation with patient history and other diagnostic information is  necessary to determine patient infection status.  Positive results do  not rule out bacterial infection or co-infection with other viruses. If result is PRESUMPTIVE POSTIVE SARS-CoV-2 nucleic acids MAY BE PRESENT.   A presumptive positive result was obtained on the submitted specimen  and confirmed on repeat testing.  While 2019 novel coronavirus  (SARS-CoV-2) nucleic acids may be present in the submitted sample  additional confirmatory testing may be necessary for epidemiological  and / or clinical management purposes  to differentiate between  SARS-CoV-2 and other Sarbecovirus currently known to infect humans.  If clinically indicated additional testing with an alternate test  methodology [phone]([id number]) is advised. The SARS-CoV-2 RNA is generally  detectable in upper and lower respiratory sp ecimens during the acute  phase of infection. The expected result is Negative. Fact Sheet for Patients:  BoilerBrush.com.cyhttps://www.fda.gov/media/136312/download Fact Sheet for Healthcare Providers: https://pope.com/https://www.fda.gov/media/136313/download This test is not yet approved or cleared by the [place] FDA and has been authorized for detection and/or diagnosis of SARS-CoV-2 by FDA under an Emergency Use Authorization (EUA).  This EUA will remain in effect (meaning this test can be used) for the duration of the COVID-19 declaration under Section 564(b)(1) of the Act, 21 U.S.C. section 360bbb-3(b)(1), unless the authorization is terminated or revoked sooner. Performed at [facility], [address] [place], [place] [address]   MRSA PCR Screening     Status: None   Collection  Time: 02/18/19  9:19 PM   Specimen: Nasal Mucosa; Nasopharyngeal  Result Value Ref Range Status   MRSA by PCR NEGATIVE NEGATIVE Final    Comment:        The GeneXpert MRSA Assay (FDA approved for NASAL specimens only), is one component of a comprehensive MRSA colonization surveillance program. It is not intended to diagnose MRSA infection nor to guide or monitor treatment for MRSA infections. Performed at [facility], [address] [place], [place] [address]          Radiology Studies: No results found.      Scheduled Meds: . ARIPiprazole  5 mg Oral Daily  . buprenorphine-naloxone  1 tablet Sublingual BID  . buPROPion  300 mg Oral Daily  . enoxaparin (LOVENOX) injection  40 mg Subcutaneous Q24H  . hydrocortisone cream   Topical BID  . insulin aspart  0-15 Units Subcutaneous TID WC  . insulin aspart protamine- aspart  35 Units Subcutaneous BID WC  . phosphorus  250 mg Oral TID  . potassium chloride  40 mEq Oral BID  . sertraline  100 mg Oral Daily   Continuous Infusions:    LOS: 4 days    Time spent: 25 minutes    Barb Merino, MD [facility] Pager [phone]  If 7PM-7AM, please contact night-coverage [email or web address] Password [email or web address] 02/22/2019, 12:12 PM

## 2019-02-22 NOTE — Plan of Care (Signed)
Reviewed plan of care and role of sitter.

## 2019-02-23 LAB — BASIC METABOLIC PANEL
Anion gap: 10 (ref 5–15)
BUN: 21 mg/dL — ABNORMAL HIGH (ref 6–20)
CO2: 32 mmol/L (ref 22–32)
Calcium: 8.5 mg/dL — ABNORMAL LOW (ref 8.9–10.3)
Chloride: 98 mmol/L (ref 98–111)
Creatinine, Ser: 1.56 mg/dL — ABNORMAL HIGH (ref 0.61–1.24)
GFR calc Af Amer: >60 mL/min (ref >60)
GFR calc non Af Amer: 58 mL/min — ABNORMAL LOW (ref >60)
Glucose, Bld: 124 mg/dL — ABNORMAL HIGH (ref 70–99)
Potassium: 2.8 mmol/L — ABNORMAL LOW (ref 3.5–5.1)
Sodium: 140 mmol/L (ref 135–145)

## 2019-02-23 LAB — GLUCOSE, CAPILLARY
Glucose-Capillary: 113 mg/dL — ABNORMAL HIGH (ref 70–99)
Glucose-Capillary: 180 mg/dL — ABNORMAL HIGH (ref 70–99)
Glucose-Capillary: 210 mg/dL — ABNORMAL HIGH (ref 70–99)
Glucose-Capillary: 329 mg/dL — ABNORMAL HIGH (ref 70–99)
Glucose-Capillary: 353 mg/dL — ABNORMAL HIGH (ref 70–99)
Glucose-Capillary: 388 mg/dL — ABNORMAL HIGH (ref 70–99)

## 2019-02-23 MED ORDER — INSULIN ASPART PROT & ASPART (70-30 MIX) 100 UNIT/ML ~~LOC~~ SUSP: 40.0000 [IU] | Freq: Two times a day (BID) | SUBCUTANEOUS | 0 refills | Status: DC

## 2019-02-23 MED ORDER — INSULIN ASPART PROT & ASPART (70-30 MIX) 100 UNIT/ML ~~LOC~~ SUSP
40.0000 [IU] | Freq: Two times a day (BID) | SUBCUTANEOUS | Status: DC
  Administered 2019-02-23 – 2019-02-25 (×4): 40 [IU] via SUBCUTANEOUS

## 2019-02-23 MED ORDER — K PHOS MONO-SOD PHOS DI & MONO 155-852-130 MG PO TABS: 250.0000 mg | ORAL_TABLET | Freq: Three times a day (TID) | ORAL | Status: DC

## 2019-02-23 NOTE — Progress Notes (Signed)
PROGRESS NOTE    Donald Hartman  ZOX:[id number] DOB: [birth year] DOA: 02/18/2019 PCP: Patient, No Pcp Per    Brief Narrative:  HPI: Donald Hartman is a 33 y.o. male with medical history significant of polysubstance abuse, cocaine, heroin, alcohol, type 1 diabetes and bipolar disorder presenting to the emergency room with multiple complaints of nausea, vomiting, body ache and abdominal pain.  Unfortunate gentleman who recently was traveling from [place] and [facility], admitted to different hospitals multiple times with drug abuse and DKA, travelled to [place] and now stuck at [place].  Recent admission for DKA, suicidal ideation, abnormal behavior and aggressiveness.  According to the patient, he was discharged from behavioral health last month, he could not afford any prescriptions so he could not take Suboxone and instead went back to doing heroin, last dose being 2 days PTA.  He is living in the streets and is homeless. For about 5 days now he has not been feeling well including nausea, poor appetite, vague abdominal pain about 5 out of 10 in intensity.  Feels very fatigued and weak with pain all over, muscle aches.  He has not taken insulin for about 2 weeks now.  Symptoms are gradual in onset and worsening in severity.  Patient was sitting in the sun all day today has not eaten much, he called EMS.  They found blood sugar 599 and brought to the ER.  ED Course: On room air.  Vitals are stable.  Blood sugar 527, creatinine 1.4 and anion gap of 31.  Given 2 L normal saline bolus in the ER and is started on titratable insulin drip. After hospitalization he said that he feels like he want to cut himself like he did on his right chest. He is on suicide precautions now.    Assessment & Plan:   Principal Problem:   Substance induced mood disorder (HCC) Active Problems:   MDD (major depressive disorder), recurrent episode, severe (HCC)   Opioid  dependence with opioid-induced mood disorder (HCC)   DKA, type 1 (HCC)   DKA (diabetic ketoacidoses) (HCC)   Diabetic acidosis with type 1 diabetes: Anion gap closed.  Noncompliance.  Electrolytes improved.  Resumed on long-acting insulin as well as sliding scale insulin.  Allow low carbohydrate diet. Increase dose of 70/30 insulin to 40 units twice day.  Blood sugars are fairly controlled.  This is his optimum control.  Hypophosphatemia: Replaced with improvement.   Acute kidney injury: Improved.  Major depressive disorder recurrent episode with suicidal ideation: Continue one-to-one Comptrollersitter. Seen by psych. Suggest admission to inpatient psychiatry unit.  Patient on multiple antipsychotics that he will continue. Social worker to find inpatient psych.  Opiate dependence with opiate-induced mood disorder: Intermittently on Suboxone.  Now resumed.    Medically stable to transfer to inpatient psych  Continue all suicide precautions and one-to-one sitter. He is volunteering to go to Psych. Will IVC if tries to leave only.    DVT prophylaxis: Subcu Lovenox Code Status: Full code Family Communication: None Disposition Plan: Inpatient psych, medically cleared to transfer when bed available.   Consultants:   None  Procedures:   None  Antimicrobials:   None   Subjective: No overnight events.  Had some aggressive behavior last night and he tried to scratch himself with a plastic utensil.  No other events.  Blood sugars fluctuating but mostly less than 300.  He says he can not straighten his head and stop thinking about killing  himself.   Objective: Vitals:   02/22/19 0440 02/22/19 1541 02/22/19 1909 02/23/19 0459  BP: 128/80 126/80 113/85 122/80  Pulse: 91 94 (!) 108 85  Resp: 15 18 16 18   Temp: 98.1 F (36.7 C) 98.7 F (37.1 C) 98 F (36.7 C) 98.6 F (37 C)  TempSrc: Oral Oral  Oral  SpO2: 100% 99% 97% 97%  Weight:      Height:        Intake/Output Summary (Last 24  hours) at 02/23/2019 1318 Last data filed at 02/23/2019 1138 Gross per 24 hour  Intake 1430 ml  Output -  Net 1430 ml   Filed Weights   02/18/19 1534 02/18/19 2058  Weight: 78.9 kg 72.6 kg    Examination:  General exam: Appears calm and comfortable,  Respiratory system: Clear to auscultation. Respiratory effort normal. Surgical scar right chest healed and dry.  Cardiovascular system: S1 & S2 heard, RRR. No JVD, murmurs, rubs, gallops or clicks. No pedal edema. Gastrointestinal system: Abdomen is nondistended, soft and nontender. No organomegaly or masses felt. Normal bowel sounds heard. Central nervous system: Alert and oriented. No focal neurological deficits. Extremities: Symmetric 5 x 5 power. Skin: No rashes, lesions or ulcers Psychiatry: Judgement and insight appear impaired.  Mood & affect flat.  No delusions or hallucinations.  He explains of thought of harming himself or cutting himself.      Data Reviewed: I have personally reviewed following labs and imaging studies  CBC: Recent Labs  Lab 02/18/19 1607 02/19/19 0307  WBC 9.1 7.8  NEUTROABS 6.6  --   HGB 14.4 11.5*  HCT 44.3 35.6*  MCV 89.0 89.2  PLT 279 184   Basic Metabolic Panel: Recent Labs  Lab 02/18/19 2121 02/19/19 0307 02/20/19 0730 02/21/19 0608 02/22/19 0705 02/23/19 0643  NA 137 133* 135 138 139 140  K 4.3 3.9 3.6 3.3* 3.8 2.8*  CL 102 106 100 101 101 98  CO2 13* 18* 25 29 29  32  GLUCOSE 271* 141* 445* 354* 242* 124*  BUN 22* 20 15 11 9  21*  CREATININE 1.32* 0.88 0.87 0.79 0.82 1.56*  CALCIUM 8.2* 7.7* 8.2* 8.4* 8.7* 8.5*  MG 2.4 2.1 1.9  --   --   --   PHOS 2.9 1.8* 2.8  --   --   --    GFR: Estimated Creatinine Clearance: 67.4 mL/min (A) (by C-G formula based on SCr of 1.56 mg/dL (H)). Liver Function Tests: Recent Labs  Lab 02/18/19 1623  AST 84*  ALT 104*  ALKPHOS 87  BILITOT 1.5*  PROT 8.3*  ALBUMIN 4.7   Recent Labs  Lab 02/18/19 1623  LIPASE 32   No results for  input(s): AMMONIA in the last 168 hours. Coagulation Profile: No results for input(s): INR, PROTIME in the last 168 hours. Cardiac Enzymes: No results for input(s): CKTOTAL, CKMB, CKMBINDEX, TROPONINI in the last 168 hours. BNP (last 3 results) No results for input(s): PROBNP in the last 8760 hours. HbA1C: No results for input(s): HGBA1C in the last 72 hours. CBG: Recent Labs  Lab 02/22/19 2016 02/23/19 0027 02/23/19 0507 02/23/19 0747 02/23/19 1148  GLUCAP 127* 113* 388* 353* 210*   Lipid Profile: No results for input(s): CHOL, HDL, LDLCALC, TRIG, CHOLHDL, LDLDIRECT in the last 72 hours. Thyroid Function Tests: No results for input(s): TSH, T4TOTAL, FREET4, T3FREE, THYROIDAB in the last 72 hours. Anemia Panel: No results for input(s): VITAMINB12, FOLATE, FERRITIN, TIBC, IRON, RETICCTPCT in the last 72 hours. Sepsis  Labs: No results for input(s): PROCALCITON, LATICACIDVEN in the last 168 hours.  Recent Results (from the past 240 hour(s))  SARS Coronavirus 2 (CEPHEID - Performed in [facility] hospital lab), Hosp Order     Status: None   Collection Time: 02/18/19  7:54 PM   Specimen: Nasopharyngeal Swab  Result Value Ref Range Status   SARS Coronavirus 2 NEGATIVE NEGATIVE Final    Comment: (NOTE) If result is NEGATIVE SARS-CoV-2 target nucleic acids are NOT DETECTED. The SARS-CoV-2 RNA is generally detectable in upper and lower  respiratory specimens during the acute phase of infection. The lowest  concentration of SARS-CoV-2 viral copies this assay can detect is 250  copies / mL. A negative result does not preclude SARS-CoV-2 infection  and should not be used as the sole basis for treatment or other  patient management decisions.  A negative result may occur with  improper specimen collection / handling, submission of specimen other  than nasopharyngeal swab, presence of viral mutation(s) within the  areas targeted by this assay, and inadequate number of viral copies   (<250 copies / mL). A negative result must be combined with clinical  observations, patient history, and epidemiological information. If result is POSITIVE SARS-CoV-2 target nucleic acids are DETECTED. The SARS-CoV-2 RNA is generally detectable in upper and lower  respiratory specimens dur ing the acute phase of infection.  Positive  results are indicative of active infection with SARS-CoV-2.  Clinical  correlation with patient history and other diagnostic information is  necessary to determine patient infection status.  Positive results do  not rule out bacterial infection or co-infection with other viruses. If result is PRESUMPTIVE POSTIVE SARS-CoV-2 nucleic acids MAY BE PRESENT.   A presumptive positive result was obtained on the submitted specimen  and confirmed on repeat testing.  While 2019 novel coronavirus  (SARS-CoV-2) nucleic acids may be present in the submitted sample  additional confirmatory testing may be necessary for epidemiological  and / or clinical management purposes  to differentiate between  SARS-CoV-2 and other Sarbecovirus currently known to infect humans.  If clinically indicated additional testing with an alternate test  methodology [phone]([id number]) is advised. The SARS-CoV-2 RNA is generally  detectable in upper and lower respiratory sp ecimens during the acute  phase of infection. The expected result is Negative. Fact Sheet for Patients:  BoilerBrush.com.cyhttps://www.fda.gov/media/136312/download Fact Sheet for Healthcare Providers: https://pope.com/https://www.fda.gov/media/136313/download This test is not yet approved or cleared by the [place] FDA and has been authorized for detection and/or diagnosis of SARS-CoV-2 by FDA under an Emergency Use Authorization (EUA).  This EUA will remain in effect (meaning this test can be used) for the duration of the COVID-19 declaration under Section 564(b)(1) of the Act, 21 U.S.C. section 360bbb-3(b)(1), unless the authorization is terminated or  revoked sooner. Performed at [facility], [address] [place], [place] [address]   MRSA PCR Screening     Status: None   Collection Time: 02/18/19  9:19 PM   Specimen: Nasal Mucosa; Nasopharyngeal  Result Value Ref Range Status   MRSA by PCR NEGATIVE NEGATIVE Final    Comment:        The GeneXpert MRSA Assay (FDA approved for NASAL specimens only), is one component of a comprehensive MRSA colonization surveillance program. It is not intended to diagnose MRSA infection nor to guide or monitor treatment for MRSA infections. Performed at [facility], [address] [place], [place] [address]  Radiology Studies: No results found.      Scheduled Meds: . ARIPiprazole  5 mg Oral Daily  . buprenorphine-naloxone  1 tablet Sublingual BID  . buPROPion  300 mg Oral Daily  . enoxaparin (LOVENOX) injection  40 mg Subcutaneous Q24H  . hydrocortisone cream   Topical BID  . insulin aspart  0-15 Units Subcutaneous TID WC  . insulin aspart protamine- aspart  40 Units Subcutaneous BID WC  . phosphorus  250 mg Oral TID  . sertraline  100 mg Oral Daily   Continuous Infusions:    LOS: 5 days    Time spent: 25 minutes    Barb Merino, MD [facility] Pager [phone]  If 7PM-7AM, please contact night-coverage [email or web address] Password [email or web address] 02/23/2019, 1:18 PM

## 2019-02-23 NOTE — Progress Notes (Signed)
Pt became upset and aggressive toward sitter. Security and [facility] notified. Pt "scratched hisself on the right inner wrist with a plastic knife that was witnessed by sitter. Scratch cleaned, trip abx ointment applied, tegaderm placed over site. Pt calmed fairly easily after security arrived on scene to assess situation. Pt apologized for being disruptive. Sitters were changed out.

## 2019-02-24 ENCOUNTER — Inpatient Hospital Stay (HOSPITAL_COMMUNITY): Payer: Self-pay

## 2019-02-24 DIAGNOSIS — E876 Hypokalemia: Secondary | ICD-10-CM

## 2019-02-24 DIAGNOSIS — N179 Acute kidney failure, unspecified: Secondary | ICD-10-CM

## 2019-02-24 DIAGNOSIS — R945 Abnormal results of liver function studies: Secondary | ICD-10-CM

## 2019-02-24 LAB — CBC WITH DIFFERENTIAL/PLATELET
Abs Immature Granulocytes: 0.02 10*3/uL (ref 0.00–0.07)
Basophils Absolute: 0 10*3/uL (ref 0.0–0.1)
Basophils Relative: 1 %
Eosinophils Absolute: 0.1 10*3/uL (ref 0.0–0.5)
Eosinophils Relative: 2 %
HCT: 43 % (ref 39.0–52.0)
Hemoglobin: 13.8 g/dL (ref 13.0–17.0)
Immature Granulocytes: 0 %
Lymphocytes Relative: 22 %
Lymphs Abs: 1.3 10*3/uL (ref 0.7–4.0)
MCH: 29.4 pg (ref 26.0–34.0)
MCHC: 32.1 g/dL (ref 30.0–36.0)
MCV: 91.7 fL (ref 80.0–100.0)
Monocytes Absolute: 0.5 10*3/uL (ref 0.1–1.0)
Monocytes Relative: 8 %
Neutro Abs: 4 10*3/uL (ref 1.7–7.7)
Neutrophils Relative %: 67 %
Platelets: 228 10*3/uL (ref 150–400)
RBC: 4.69 MIL/uL (ref 4.22–5.81)
RDW: 15.8 % — ABNORMAL HIGH (ref 11.5–15.5)
WBC: 5.9 10*3/uL (ref 4.0–10.5)
nRBC: 0 % (ref 0.0–0.2)

## 2019-02-24 LAB — COMPREHENSIVE METABOLIC PANEL
ALT: 144 U/L — ABNORMAL HIGH (ref 0–44)
AST: 139 U/L — ABNORMAL HIGH (ref 15–41)
Albumin: 3.7 g/dL (ref 3.5–5.0)
Alkaline Phosphatase: 59 U/L (ref 38–126)
Anion gap: 11 (ref 5–15)
BUN: 7 mg/dL (ref 6–20)
CO2: 28 mmol/L (ref 22–32)
Calcium: 9.2 mg/dL (ref 8.9–10.3)
Chloride: 98 mmol/L (ref 98–111)
Creatinine, Ser: 0.8 mg/dL (ref 0.61–1.24)
GFR calc Af Amer: >60 mL/min (ref >60)
GFR calc non Af Amer: >60 mL/min (ref >60)
Glucose, Bld: 174 mg/dL — ABNORMAL HIGH (ref 70–99)
Potassium: 3.9 mmol/L (ref 3.5–5.1)
Sodium: 137 mmol/L (ref 135–145)
Total Bilirubin: 0.5 mg/dL (ref 0.3–1.2)
Total Protein: 7 g/dL (ref 6.5–8.1)

## 2019-02-24 LAB — GLUCOSE, CAPILLARY
Glucose-Capillary: 151 mg/dL — ABNORMAL HIGH (ref 70–99)
Glucose-Capillary: 201 mg/dL — ABNORMAL HIGH (ref 70–99)
Glucose-Capillary: 285 mg/dL — ABNORMAL HIGH (ref 70–99)
Glucose-Capillary: 185 mg/dL — ABNORMAL HIGH (ref 70–99)

## 2019-02-24 LAB — MAGNESIUM: Magnesium: 1.8 mg/dL (ref 1.7–2.4)

## 2019-02-24 LAB — PHOSPHORUS: Phosphorus: 4.4 mg/dL (ref 2.5–4.6)

## 2019-02-24 MED ORDER — POTASSIUM CHLORIDE IN NACL 40-0.9 MEQ/L-% IV SOLN
INTRAVENOUS | Status: DC
  Administered 2019-02-24: 10:00:00 75 mL/h via INTRAVENOUS
  Filled 2019-02-24: qty 1000

## 2019-02-24 MED ORDER — POTASSIUM CHLORIDE IN NACL 40-0.9 MEQ/L-% IV SOLN
INTRAVENOUS | Status: AC
  Administered 2019-02-24: 11:00:00 75 mL/h via INTRAVENOUS
  Filled 2019-02-24: qty 1000

## 2019-02-24 NOTE — Progress Notes (Signed)
PROGRESS NOTE    Donald HenleDaniel Hartman  WUJ:[id number] DOB: Apr 14, 1986 DOA: 02/18/2019 PCP: Patient, No Pcp Per   Brief Narrative:  HPI per Dr. Dorcas CarrowKuber Hartman on 02/18/2019 Donald Skillaniel Martinezis a 33 y.o.malewith medical history significant ofpolysubstance abuse, cocaine, heroin, alcohol, type 1 diabetes and bipolar disorder presenting to the emergency room with multiple complaints of nausea, vomiting, body ache and abdominal pain. Unfortunate gentleman who recently was [place] from [place] to [place] and [facility], admitted to different hospitals multiple times with drug abuse and DKA, travelled to [place] and now stuck at [place]. Recent admission for DKA, suicidal ideation, abnormal behavior and aggressiveness. According to the patient, he was discharged from behavioral health last month, he could not afford any prescriptions so he could not take Suboxone and instead went back to doing heroin, last dose being 2 days PTA. He is living in the streets and is homeless. For about 5 days now he has not been feeling well including nausea, poor appetite, vague abdominal pain about 5 out of 10 in intensity. Feels very fatigued and weak with pain all over, muscle aches. He has not taken insulin for about 2 weeks now. Symptoms are gradual in onset and worsening in severity. Patient was sitting in the sun all day today has not eaten much, he called EMS. They found blood sugar 599 and brought to the ER.  ED Course:On room air. Vitals are stable. Blood sugar 527, creatinine 1.4 and anion gap of 31. Given 2 L normal saline bolus in the ER and is started on titratable insulin drip. After hospitalization he said that he feels like he want to cut himself like he did on his right chest. He is on suicide precautions now.   **Interim History  Patient continues to have one-to-one sitter and was not a suicidal today.  Hospitalization has been complicated by abnormal LFTs, AKI,  hypokalemia and hypophosphatemia.  Will work-up abnormal LFTs with right upper quadrant ultrasound and a acute hepatitis panel.  Currently is medically stable to be discharged to inpatient psychiatric hospitalization however still awaiting bed placement.  Assessment & Plan:   Principal Problem:   Substance induced mood disorder (HCC) Active Problems:   MDD (major depressive disorder), recurrent episode, severe (HCC)   Opioid dependence with opioid-induced mood disorder (HCC)   DKA, type 1 (HCC)   DKA (diabetic ketoacidoses) (HCC)  Diabetic Acidosis with type 1 Diabetes, improved  -Likely 2/2 to Noncompliance -Anion gap closed. -Electrolytes improved.  Resumed on long-acting insulin Novolog 70/30 at 40 units BID as well as Moderate Novolog sliding scale insulin.  -Allow low carbohydrate diet. -Blood sugars are fairly controlled as CBG's ranging from 151-353.  Hypophosphatemia -Improved and is 4.4 -Replaced with po KPhos Neutral 250 mg po TID and this has now stopped -Continue to Monitor and Trend and Replete as Necessary -Repeat Phos Level in the AM   Acute kidney injury -Improved as BUN/Cr went from 21/1.56 -> 7/0.80 -Nonetheless started Gentle IVF Hydration with NS + 40 mEQ of KCl at 75 mL/hr x12 hours -Avoid Nephrotoxic Medications, Contrast Dyes, and Hypotension if possible -Repeat CMP in AM   Major Depressive Disorder recurrent episode with suicidal ideation -Continue one-to-one sitter. Seen by psych. Suggest admission to inpatient psychiatry unit.   -Patient on multiple antipsychotics that he will continue including Aripiprazole 5 mg po Daily, Bupropion 300 mg po Daily and Sertraline 100 mg po Daily and Trazadone 100 mg po qHSprn Sleep -Child psychotherapistocial worker Consulted for  assistance to find inpatient psych bed  Hypokalemia -Patient's K+ was 2.8 yesterday -Replete and is now 3.9 this AM; Started IVF with NS + 40 mEQ of KCl at 75 mL/hr -Stopped KPhos Repletement -Mag Level was  1.8 -Continue to Monitor and Trend -Repeat CMP in AM   Abnormal LFT's -Patient's AST went from 84 -> 139 and ALT went from 104 -> 144 -Check RUQ U/S and Acute Hepatitis Panel -Continue to Monitor and Trend LFT's and repeat CMP in AM  -Given Gentle IVF Hydration with NS + 40 mEQ KCl at 75 mL/hr x12 hours  Hyperbilirubinemia -Patient's T Bili went from 1.5 and is now 0.5 -Continue to Monitor and Trend and repeat CMP in AM   Opiate dependence with opiate-induced mood disorder: -Intermittently on Suboxone.  Now resumed and is on Buprenoprhine-Naloxone 8-2 mg 1 tab SL BID   Currently Medically stable to transfer to inpatient Psych but resumed IVF due to AKI and worsening Cr. Cr is improved and will work up LFTs  Continue all suicide precautions and one-to-one Actuary. He is volunteering to go to Psych. Will IVC if tries to leave only.   DVT prophylaxis: Enoxaparin 40 mg sq q24h Code Status: FULL CODE  Family Communication: No family present at bedside  Disposition Plan: D/C To Inpatient Psych when bed is available   Consultants:   Psychiatry    Procedures:  None  Antimicrobials:  Anti-infectives (From admission, onward)   None     Subjective: Seen and examined at bedside denying chest pain, lightheadedness or dizziness.  Discussing home lunch options with the nurse tech.  No complaints and not as suicidal today.  Objective: Vitals:   02/23/19 1956 02/23/19 2338 02/24/19 0459 02/24/19 0733  BP: (!) 124/107 118/78 107/72 106/81  Pulse: (!) 109 100 77 100  Resp: 16 16 16    Temp: 98.9 F (37.2 C) 97.7 F (36.5 C) 98.4 F (36.9 C) (!) 97.4 F (36.3 C)  TempSrc: Oral Oral Oral Oral  SpO2: 97% 95% 100% 95%  Weight:      Height:        Intake/Output Summary (Last 24 hours) at 02/24/2019 1051 Last data filed at 02/23/2019 6734 Gross per 24 hour  Intake 1625 ml  Output --  Net 1625 ml   Filed Weights   02/18/19 1534 02/18/19 2058  Weight: 78.9 kg 72.6 kg    Examination: Physical Exam:  Constitutional: WN/WD Male in NAD and appears calm and comfortable Eyes: Lids and conjunctivae normal, sclerae anicteric  ENMT: External Ears, Nose appear normal. Grossly normal hearing. Neck: Appears normal, supple, no cervical masses, normal ROM, no appreciable thyromegaly; no JVD Respiratory: Diminished to auscultation bilaterally, no wheezing, rales, rhonchi or crackles. Normal respiratory effort and patient is not tachypenic. No accessory muscle use.  Cardiovascular: Mildly tachycardic, no murmurs / rubs / gallops. S1 and S2 auscultated. No extremity edema.  Abdomen: Soft, non-tender, non-distended. No masses palpated. No appreciable hepatosplenomegaly. Bowel sounds positive x4.  GU: Deferred. Musculoskeletal: No clubbing / cyanosis of digits/nails. No joint deformity upper and lower extremities.  Skin: No rashes, lesions, ulcers on a limited exam but has multiple tattoos on chest and face. No induration; Warm and dry.  Neurologic: CN 2-12 grossly intact with no focal deficits. Romberg sign and cerebellar reflexes not assessed.  Psychiatric: Normal judgment and insight. Alert and oriented x 3. Normal mood but slightly flat affect. Currently not wanting to hurt himself.   Data Reviewed: I have personally reviewed following labs  and imaging studies  CBC: Recent Labs  Lab 02/18/19 1607 02/19/19 0307 02/24/19 0935  WBC 9.1 7.8 5.9  NEUTROABS 6.6  --  4.0  HGB 14.4 11.5* 13.8  HCT 44.3 35.6* 43.0  MCV 89.0 89.2 91.7  PLT 279 184 228   Basic Metabolic Panel: Recent Labs  Lab 02/18/19 2121 02/19/19 0307 02/20/19 0730 02/21/19 0608 02/22/19 0705 02/23/19 0643 02/24/19 0935  NA 137 133* 135 138 139 140 137  K 4.3 3.9 3.6 3.3* 3.8 2.8* 3.9  CL 102 106 100 101 101 98 98  CO2 13* 18* 25 29 29  32 28  GLUCOSE 271* 141* 445* 354* 242* 124* 174*  BUN 22* 20 15 11 9  21* 7  CREATININE 1.32* 0.88 0.87 0.79 0.82 1.56* 0.80  CALCIUM 8.2* 7.7* 8.2* 8.4*  8.7* 8.5* 9.2  MG 2.4 2.1 1.9  --   --   --  1.8  PHOS 2.9 1.8* 2.8  --   --   --  4.4   GFR: Estimated Creatinine Clearance: 131.3 mL/min (by C-G formula based on SCr of 0.8 mg/dL). Liver Function Tests: Recent Labs  Lab 02/18/19 1623 02/24/19 0935  AST 84* 139*  ALT 104* 144*  ALKPHOS 87 59  BILITOT 1.5* 0.5  PROT 8.3* 7.0  ALBUMIN 4.7 3.7   Recent Labs  Lab 02/18/19 1623  LIPASE 32   No results for input(s): AMMONIA in the last 168 hours. Coagulation Profile: No results for input(s): INR, PROTIME in the last 168 hours. Cardiac Enzymes: No results for input(s): CKTOTAL, CKMB, CKMBINDEX, TROPONINI in the last 168 hours. BNP (last 3 results) No results for input(s): PROBNP in the last 8760 hours. HbA1C: No results for input(s): HGBA1C in the last 72 hours. CBG: Recent Labs  Lab 02/23/19 0747 02/23/19 1148 02/23/19 1604 02/23/19 2001 02/24/19 0728  GLUCAP 353* 210* 329* 180* 151*   Lipid Profile: No results for input(s): CHOL, HDL, LDLCALC, TRIG, CHOLHDL, LDLDIRECT in the last 72 hours. Thyroid Function Tests: No results for input(s): TSH, T4TOTAL, FREET4, T3FREE, THYROIDAB in the last 72 hours. Anemia Panel: No results for input(s): VITAMINB12, FOLATE, FERRITIN, TIBC, IRON, RETICCTPCT in the last 72 hours. Sepsis Labs: No results for input(s): PROCALCITON, LATICACIDVEN in the last 168 hours.  Recent Results (from the past 240 hour(s))  SARS Coronavirus 2 (CEPHEID - Performed in [facility] hospital lab), Hosp Order     Status: None   Collection Time: 02/18/19  7:54 PM   Specimen: Nasopharyngeal Swab  Result Value Ref Range Status   SARS Coronavirus 2 NEGATIVE NEGATIVE Final    Comment: (NOTE) If result is NEGATIVE SARS-CoV-2 target nucleic acids are NOT DETECTED. The SARS-CoV-2 RNA is generally detectable in upper and lower  respiratory specimens during the acute phase of infection. The lowest  concentration of SARS-CoV-2 viral copies this assay can  detect is 250  copies / mL. A negative result does not preclude SARS-CoV-2 infection  and should not be used as the sole basis for treatment or other  patient management decisions.  A negative result may occur with  improper specimen collection / handling, submission of specimen other  than nasopharyngeal swab, presence of viral mutation(s) within the  areas targeted by this assay, and inadequate number of viral copies  (<250 copies / mL). A negative result must be combined with clinical  observations, patient history, and epidemiological information. If result is POSITIVE SARS-CoV-2 target nucleic acids are DETECTED. The SARS-CoV-2 RNA is generally detectable in  upper and lower  respiratory specimens dur ing the acute phase of infection.  Positive  results are indicative of active infection with SARS-CoV-2.  Clinical  correlation with patient history and other diagnostic information is  necessary to determine patient infection status.  Positive results do  not rule out bacterial infection or co-infection with other viruses. If result is PRESUMPTIVE POSTIVE SARS-CoV-2 nucleic acids MAY BE PRESENT.   A presumptive positive result was obtained on the submitted specimen  and confirmed on repeat testing.  While 2019 novel coronavirus  (SARS-CoV-2) nucleic acids may be present in the submitted sample  additional confirmatory testing may be necessary for epidemiological  and / or clinical management purposes  to differentiate between  SARS-CoV-2 and other Sarbecovirus currently known to infect humans.  If clinically indicated additional testing with an alternate test  methodology [phone]([id number]) is advised. The SARS-CoV-2 RNA is generally  detectable in upper and lower respiratory sp ecimens during the acute  phase of infection. The expected result is Negative. Fact Sheet for Patients:  BoilerBrush.com.cyhttps://www.fda.gov/media/136312/download Fact Sheet for Healthcare  Providers: https://pope.com/https://www.fda.gov/media/136313/download This test is not yet approved or cleared by the [place] FDA and has been authorized for detection and/or diagnosis of SARS-CoV-2 by FDA under an Emergency Use Authorization (EUA).  This EUA will remain in effect (meaning this test can be used) for the duration of the COVID-19 declaration under Section 564(b)(1) of the Act, 21 U.S.C. section 360bbb-3(b)(1), unless the authorization is terminated or revoked sooner. Performed at [facility], [address] [place], [place] [address]   MRSA PCR Screening     Status: None   Collection Time: 02/18/19  9:19 PM   Specimen: Nasal Mucosa; Nasopharyngeal  Result Value Ref Range Status   MRSA by PCR NEGATIVE NEGATIVE Final    Comment:        The GeneXpert MRSA Assay (FDA approved for NASAL specimens only), is one component of a comprehensive MRSA colonization surveillance program. It is not intended to diagnose MRSA infection nor to guide or monitor treatment for MRSA infections. Performed at [facility], [address] [place], [place] [address]    Radiology Studies: No results found.  Scheduled Meds:  ARIPiprazole  5 mg Oral Daily   buprenorphine-naloxone  1 tablet Sublingual BID   buPROPion  300 mg Oral Daily   enoxaparin (LOVENOX) injection  40 mg Subcutaneous Q24H   hydrocortisone cream   Topical BID   insulin aspart  0-15 Units Subcutaneous TID WC   insulin aspart protamine- aspart  40 Units Subcutaneous BID WC   sertraline  100 mg Oral Daily   Continuous Infusions:  0.9 % NaCl with KCl 40 mEq / L       LOS: 6 days   Merlene Laughtermair Latif Tyonna Talerico, DO [facility] PAGER is on AMION  If 7PM-7AM, please contact night-coverage [email or web address] Password [email or web address] 02/24/2019, 10:51 AM

## 2019-02-24 NOTE — TOC Progression Note (Signed)
Transition of Care [facility]) - Progression Note    Patient Details  Name: Donald Hartman MRN: [id number] Date of Birth: Sep 27, 1985  Transition of Care [facility]) CM/SW Contact  Servando Snare, [place] Phone Number: 02/24/2019, 1:36 PM  Clinical Narrative:   LCSW [facility] to [facility]. [facility] took referral but stated they do not anticipate any beds today.   Patient was declined by other facilities. Lack of insurance is a barrier to placement out of the [facility] system.     Expected Discharge Plan: Psychiatric Hospital Barriers to Discharge: Homeless with medical needs, Inadequate or no insurance, Psych Bed not available  Expected Discharge Plan and Services Expected Discharge Plan: Psychiatric Hospital In-house Referral: Clinical Social Work Discharge Planning Services: NA   Living arrangements for the past 2 months: No permanent address, Homeless Expected Discharge Date: 02/23/19                                     Social Determinants of Health (SDOH) Interventions    Readmission Risk Interventions No flowsheet data found.

## 2019-02-25 ENCOUNTER — Inpatient Hospital Stay (HOSPITAL_COMMUNITY): Payer: Self-pay

## 2019-02-25 LAB — CBC WITH DIFFERENTIAL/PLATELET
Abs Immature Granulocytes: 0.02 10*3/uL (ref 0.00–0.07)
Basophils Absolute: 0 10*3/uL (ref 0.0–0.1)
Basophils Relative: 1 %
Eosinophils Absolute: 0.1 10*3/uL (ref 0.0–0.5)
Eosinophils Relative: 3 %
HCT: 39 % (ref 39.0–52.0)
Hemoglobin: 12.5 g/dL — ABNORMAL LOW (ref 13.0–17.0)
Immature Granulocytes: 0 %
Lymphocytes Relative: 38 %
Lymphs Abs: 2 10*3/uL (ref 0.7–4.0)
MCH: 29.1 pg (ref 26.0–34.0)
MCHC: 32.1 g/dL (ref 30.0–36.0)
MCV: 90.7 fL (ref 80.0–100.0)
Monocytes Absolute: 0.8 10*3/uL (ref 0.1–1.0)
Monocytes Relative: 14 %
Neutro Abs: 2.3 10*3/uL (ref 1.7–7.7)
Neutrophils Relative %: 44 %
Platelets: UNDETERMINED 10*3/uL (ref 150–400)
RBC: 4.3 MIL/uL (ref 4.22–5.81)
RDW: 15.8 % — ABNORMAL HIGH (ref 11.5–15.5)
WBC: 5.2 10*3/uL (ref 4.0–10.5)
nRBC: 0 % (ref 0.0–0.2)

## 2019-02-25 LAB — COMPREHENSIVE METABOLIC PANEL
ALT: 120 U/L — ABNORMAL HIGH (ref 0–44)
AST: 101 U/L — ABNORMAL HIGH (ref 15–41)
Albumin: 3.2 g/dL — ABNORMAL LOW (ref 3.5–5.0)
Alkaline Phosphatase: 55 U/L (ref 38–126)
Anion gap: 10 (ref 5–15)
BUN: 10 mg/dL (ref 6–20)
CO2: 27 mmol/L (ref 22–32)
Calcium: 8.7 mg/dL — ABNORMAL LOW (ref 8.9–10.3)
Chloride: 97 mmol/L — ABNORMAL LOW (ref 98–111)
Creatinine, Ser: 0.92 mg/dL (ref 0.61–1.24)
GFR calc Af Amer: >60 mL/min (ref >60)
GFR calc non Af Amer: >60 mL/min (ref >60)
Glucose, Bld: 272 mg/dL — ABNORMAL HIGH (ref 70–99)
Potassium: 4.2 mmol/L (ref 3.5–5.1)
Sodium: 134 mmol/L — ABNORMAL LOW (ref 135–145)
Total Bilirubin: 0.1 mg/dL — ABNORMAL LOW (ref 0.3–1.2)
Total Protein: 5.9 g/dL — ABNORMAL LOW (ref 6.5–8.1)

## 2019-02-25 LAB — GLUCOSE, CAPILLARY
Glucose-Capillary: 333 mg/dL — ABNORMAL HIGH (ref 70–99)
Glucose-Capillary: 98 mg/dL (ref 70–99)
Glucose-Capillary: 94 mg/dL (ref 70–99)
Glucose-Capillary: 59 mg/dL — ABNORMAL LOW (ref 70–99)
Glucose-Capillary: 121 mg/dL — ABNORMAL HIGH (ref 70–99)

## 2019-02-25 LAB — MAGNESIUM: Magnesium: 1.8 mg/dL (ref 1.7–2.4)

## 2019-02-25 LAB — HEPATITIS PANEL, ACUTE
HCV Ab: >11 s/co ratio — ABNORMAL HIGH (ref 0.0–0.9)
Hep A IgM: NEGATIVE
Hep B C IgM: NEGATIVE
Hepatitis B Surface Ag: NEGATIVE

## 2019-02-25 LAB — PHOSPHORUS: Phosphorus: 4 mg/dL (ref 2.5–4.6)

## 2019-02-25 MED ORDER — INSULIN ASPART 100 UNIT/ML ~~LOC~~ SOLN
0.0000 [IU] | Freq: Three times a day (TID) | SUBCUTANEOUS | Status: DC
  Administered 2019-02-26: 08:00:00 11 [IU] via SUBCUTANEOUS

## 2019-02-25 MED ORDER — DICLOFENAC SODIUM 1 % TD GEL
4.0000 g | Freq: Four times a day (QID) | TRANSDERMAL | Status: DC
  Administered 2019-02-25: 4 g via TOPICAL
  Filled 2019-02-25: qty 100

## 2019-02-25 MED ORDER — INSULIN ASPART PROT & ASPART (70-30 MIX) 100 UNIT/ML ~~LOC~~ SUSP
42.0000 [IU] | Freq: Two times a day (BID) | SUBCUTANEOUS | Status: DC
  Administered 2019-02-25 – 2019-02-26 (×2): 42 [IU] via SUBCUTANEOUS

## 2019-02-25 NOTE — TOC Progression Note (Signed)
Transition of Care [facility]) - Progression Note    Patient Details  Name: Donald Hartman MRN: [id number] Date of Birth: [birth year]  [facility]) CM/SW Contact  Servando Snare, [place] Phone Number: 02/25/2019, 4:40 PM  Clinical Narrative:   LCSW made [facility] referral for patient. If accepted patient will need to be IVC'd for transport. LCSW will follow up daily to confirm patient is wait listed.    LCSW notified attending that patient will need to be seen by psych again since most recent note is older than 72 hrs.      Expected Discharge Plan: Psychiatric Hospital Barriers to Discharge: Homeless with medical needs, Inadequate or no insurance, Psych Bed not available  Expected Discharge Plan and Services Expected Discharge Plan: Psychiatric Hospital In-house Referral: Clinical Social Work Discharge Planning Services: NA   Living arrangements for the past 2 months: No permanent address, Homeless Expected Discharge Date: 02/23/19                                     Social Determinants of Health (SDOH) Interventions    Readmission Risk Interventions No flowsheet data found.

## 2019-02-25 NOTE — Progress Notes (Signed)
Inpatient Diabetes Program Recommendations  AACE/ADA: New Consensus Statement on Inpatient Glycemic Control (2015)  Target Ranges:  Prepandial:   less than 140 mg/dL      Peak postprandial:   less than 180 mg/dL (1-2 hours)      Critically ill patients:  140 - 180 mg/dL   Lab Results  Component Value Date   GLUCAP 333 (H) 02/25/2019   HGBA1C 10.4 (H) 01/28/2019    Review of Glycemic Control  Blood sugars above goal of 140-180 mg/dL. Needs insulin adjustment.  Inpatient Diabetes Program Recommendations:     Increase 70/30 to 42 units bid.  Will follow glucose trends at [facility].  Thank you. Lorenda Peck, RD, LDN, CDE Inpatient Diabetes Coordinator [phone]
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
## 2019-02-26 LAB — CBC WITH DIFFERENTIAL/PLATELET
Abs Immature Granulocytes: 0.01 10*3/uL (ref 0.00–0.07)
Basophils Absolute: 0 10*3/uL (ref 0.0–0.1)
Basophils Relative: 1 %
Eosinophils Absolute: 0.1 10*3/uL (ref 0.0–0.5)
Eosinophils Relative: 3 %
HCT: 37.7 % — ABNORMAL LOW (ref 39.0–52.0)
Hemoglobin: 11.9 g/dL — ABNORMAL LOW (ref 13.0–17.0)
Immature Granulocytes: 0 %
Lymphocytes Relative: 42 %
Lymphs Abs: 2.1 10*3/uL (ref 0.7–4.0)
MCH: 29.7 pg (ref 26.0–34.0)
MCHC: 31.6 g/dL (ref 30.0–36.0)
MCV: 94 fL (ref 80.0–100.0)
Monocytes Absolute: 0.6 10*3/uL (ref 0.1–1.0)
Monocytes Relative: 13 %
Neutro Abs: 2 10*3/uL (ref 1.7–7.7)
Neutrophils Relative %: 41 %
Platelets: 232 10*3/uL (ref 150–400)
RBC: 4.01 MIL/uL — ABNORMAL LOW (ref 4.22–5.81)
RDW: 15.9 % — ABNORMAL HIGH (ref 11.5–15.5)
WBC: 4.8 10*3/uL (ref 4.0–10.5)
nRBC: 0 % (ref 0.0–0.2)

## 2019-02-26 LAB — COMPREHENSIVE METABOLIC PANEL
ALT: 106 U/L — ABNORMAL HIGH (ref 0–44)
AST: 81 U/L — ABNORMAL HIGH (ref 15–41)
Albumin: 3.1 g/dL — ABNORMAL LOW (ref 3.5–5.0)
Alkaline Phosphatase: 48 U/L (ref 38–126)
Anion gap: 8 (ref 5–15)
BUN: 11 mg/dL (ref 6–20)
CO2: 27 mmol/L (ref 22–32)
Calcium: 8.5 mg/dL — ABNORMAL LOW (ref 8.9–10.3)
Chloride: 102 mmol/L (ref 98–111)
Creatinine, Ser: 0.93 mg/dL (ref 0.61–1.24)
GFR calc Af Amer: >60 mL/min (ref >60)
GFR calc non Af Amer: >60 mL/min (ref >60)
Glucose, Bld: 235 mg/dL — ABNORMAL HIGH (ref 70–99)
Potassium: 3.8 mmol/L (ref 3.5–5.1)
Sodium: 137 mmol/L (ref 135–145)
Total Bilirubin: 0.3 mg/dL (ref 0.3–1.2)
Total Protein: 5.8 g/dL — ABNORMAL LOW (ref 6.5–8.1)

## 2019-02-26 LAB — MAGNESIUM: Magnesium: 2 mg/dL (ref 1.7–2.4)

## 2019-02-26 LAB — IRON AND TIBC
Iron: 41 ug/dL — ABNORMAL LOW (ref 45–182)
Saturation Ratios: 12 % — ABNORMAL LOW (ref 17.9–39.5)
TIBC: 338 ug/dL (ref 250–450)
UIBC: 297 ug/dL

## 2019-02-26 LAB — FERRITIN: Ferritin: 16 ng/mL — ABNORMAL LOW (ref 24–336)

## 2019-02-26 LAB — FOLATE: Folate: 22.6 ng/mL (ref >5.9)

## 2019-02-26 LAB — RETICULOCYTES
Immature Retic Fract: 8.4 % (ref 2.3–15.9)
RBC.: 4.01 MIL/uL — ABNORMAL LOW (ref 4.22–5.81)
Retic Count, Absolute: 48.1 10*3/uL (ref 19.0–186.0)
Retic Ct Pct: 1.2 % (ref 0.4–3.1)

## 2019-02-26 LAB — GLUCOSE, CAPILLARY
Glucose-Capillary: 257 mg/dL — ABNORMAL HIGH (ref 70–99)
Glucose-Capillary: 241 mg/dL — ABNORMAL HIGH (ref 70–99)

## 2019-02-26 LAB — VITAMIN B12: Vitamin B-12: 297 pg/mL (ref 180–914)

## 2019-02-26 LAB — PHOSPHORUS: Phosphorus: 4.8 mg/dL — ABNORMAL HIGH (ref 2.5–4.6)

## 2019-02-26 MED ORDER — POLYSACCHARIDE IRON COMPLEX 150 MG PO CAPS: 150.0000 mg | ORAL_CAPSULE | Freq: Every day | ORAL | 0 refills | Status: AC

## 2019-02-26 MED ORDER — TRAZODONE HCL 100 MG PO TABS: 100.0000 mg | ORAL_TABLET | Freq: Every evening | ORAL | 0 refills | Status: DC | PRN

## 2019-02-26 MED ORDER — SERTRALINE HCL 100 MG PO TABS: 100.0000 mg | ORAL_TABLET | Freq: Every day | ORAL | 0 refills | Status: DC

## 2019-02-26 MED ORDER — INSULIN ASPART PROT & ASPART (70-30 MIX) 100 UNIT/ML ~~LOC~~ SUSP: 42.0000 [IU] | Freq: Two times a day (BID) | SUBCUTANEOUS | 0 refills | Status: DC

## 2019-02-26 MED ORDER — "INSULIN SYRINGE-NEEDLE U-100 30G X 15/64"" 0.5 ML MISC": 1.0000 | Freq: Every day | 0 refills | Status: AC

## 2019-02-26 MED ORDER — POLYSACCHARIDE IRON COMPLEX 150 MG PO CAPS
150.0000 mg | ORAL_CAPSULE | Freq: Every day | ORAL | Status: DC
  Filled 2019-02-26: qty 1

## 2019-02-26 MED ORDER — BUPROPION HCL ER (XL) 300 MG PO TB24: 300.0000 mg | ORAL_TABLET | Freq: Every day | ORAL | 0 refills | Status: DC

## 2019-02-26 MED ORDER — INSULIN ASPART 100 UNIT/ML ~~LOC~~ SOLN
0.0000 [IU] | Freq: Three times a day (TID) | SUBCUTANEOUS | Status: DC
  Administered 2019-02-26: 5 [IU] via SUBCUTANEOUS

## 2019-02-26 MED ORDER — BLOOD GLUCOSE MONITOR KIT: PACK | 0 refills | Status: AC

## 2019-02-26 MED ORDER — INSULIN ASPART 100 UNIT/ML ~~LOC~~ SOLN: 0.0000 [IU] | Freq: Three times a day (TID) | SUBCUTANEOUS | 0 refills | Status: DC

## 2019-02-26 MED FILL — !NOVOLOG MIX 70/30 VIAL: 70-30/ML | 23 days supply | Qty: 20 | Fill #0
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
## 2019-02-26 NOTE — Progress Notes (Signed)
[facility] will fill pt's medications. Offered Taxi to pt who told his RN that he did not need one. Also pt was told that he need to pick medications up before 5PM.

## 2019-02-26 NOTE — Discharge Summary (Signed)
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 sooner. Performed at [facility], [place] [address] [place], [place] [address]   MRSA PCR Screening     Status: None   Collection Time: 02/18/19  9:19 PM  Specimen: Nasal Mucosa; Nasopharyngeal  Result Value Ref Range Status   MRSA by PCR NEGATIVE NEGATIVE Final    Comment:        The GeneXpert MRSA Assay (FDA approved for NASAL specimens only), is one component of a comprehensive MRSA colonization surveillance program. It is not intended to diagnose MRSA infection nor to guide or monitor treatment for MRSA infections. Performed at [facility],  [place] [address], [place], [place] [address]    Time coordinating discharge: 35 minutes  SIGNED:  Kerney Elbe, DO [facility] 02/26/2019, 3:03 PM Pager is on [place]  If 7PM-7AM, please contact night-coverage [email or web address] Password [email or web address]

## 2019-02-26 NOTE — Progress Notes (Signed)
PROGRESS NOTE    Donald Hartman  ZOX:[id number] DOB: 1986-03-13 DOA: 02/18/2019 PCP: Patient, No Pcp Per  Brief Narrative:  HPI per Dr. Dorcas Carrow on 02/18/2019 Donald Hartman a 32 y.o.malewith medical history significant ofpolysubstance abuse, cocaine, heroin, alcohol, type 1 diabetes and bipolar disorder presenting to the emergency room with multiple complaints of nausea, vomiting, body ache and abdominal pain. Unfortunate gentleman who recently was traveling from [place] to [place] and got off in [facility], admitted to different hospitals multiple times with drug abuse and DKA, travelled to [place] and now stuck at [place]. Recent admission for DKA, suicidal ideation, abnormal behavior and aggressiveness. According to the patient, he was discharged from behavioral health last month, he could not afford any prescriptions so he could not take Suboxone and instead went back to doing heroin, last dose being 2 days PTA. He is living in the streets and is homeless. For about 5 days now he has not been feeling well including nausea, poor appetite, vague abdominal pain about 5 out of 10 in intensity. Feels very fatigued and weak with pain all over, muscle aches. He has not taken insulin for about 2 weeks now. Symptoms are gradual in onset and worsening in severity. Patient was sitting in the sun all day today has not eaten much, he called EMS. They found blood sugar 599 and brought to the ER.  ED Course:On room air. Vitals are stable. Blood sugar 527, creatinine 1.4 and anion gap of 31. Given 2 L normal saline bolus in the ER and is started on titratable insulin drip. After hospitalization he said that he feels like he want to cut himself like he did on his right chest. He is on suicide precautions now.   **Interim History  Patient continues to have one-to-one sitter and was not a suicidal today but was asking for a Clonidine and Librium Taper for unclear  reasons except he was concerned about Withdrawals.  Hospitalization has been complicated by abnormal LFTs, AKI, hypokalemia and hypophosphatemia and now Hypoglycemia 2/2 not Eating.  Worked-up abnormal LFTs with right upper quadrant ultrasound and a acute hepatitis panel.  LFTs have been trending down and ultrasound of the liver was normal. Patient has hepatitis C and will check a viral load and genotype. Yesterday he was complaining of some right-sided rib pain so we have ordered a chest x-ray and ordered analgesics and Pain improved and CXR was normal and showed no rib dysfunction. Currently is medically stable to be discharged to inpatient psychiatric hospitalization however still awaiting bed placement and likely will not be today.   Assessment & Plan:   Principal Problem:   Substance induced mood disorder (HCC) Active Problems:   MDD (major depressive disorder), recurrent episode, severe (HCC)   Opioid dependence with opioid-induced mood disorder (HCC)   DKA, type 1 (HCC)   DKA (diabetic ketoacidoses) (HCC)  Diabetic Acidosis with type 1 Diabetes, improved  -Likely 2/2 to Noncompliance -Anion gap closed. -Electrolytes improved.  Resumed on long-acting insulin Novolog 70/30 at 40 units BID but increased to 42 units BID. Increased Moderate Novolog sliding scale insulin to Resistant  -Allow low carbohydrate diet. -Blood sugars were fairly controlled but elevated today.  CBG's ranging from 59-333. -Per nurse he was Hypoglycemic 2/2 to Not eating and only eating Breakfast and no Lunch or Dinner   Hypophosphatemia -> Hyperphosphatemia  -Improved and is 4.8 -Replaced with po KPhos Neutral 250 mg po TID and this has now stopped -Continue to Monitor  and Trend and Replete as Necessary -Repeat Phos Level in the AM   Acute kidney injury -Improved as BUN/Cr went from 21/1.56 -> 7/0.80 -> 10/0.92 -> 11/0.93 -Gentle IVF Hydration with NS + 40 mEQ of KCl at 75 mL/hr x12 hours now stopped  -Avoid  Nephrotoxic Medications, Contrast Dyes, and Hypotension if possible -Repeat CMP in AM if still remains in the Hospital   Major Depressive Disorder recurrent episode with suicidal ideation Hx of Drug Use/Abuse -Continue one-to-one sitter. Seen by psych. Suggest admission to inpatient psychiatry unit.   -Patient on multiple antipsychotics that he will continue including Aripiprazole 5 mg po Daily, Bupropion 300 mg po Daily and Sertraline 100 mg po Daily and Trazadone 100 mg po qHSprn Sleep -Social worker Consulted for assistance to find inpatient psych bed -Asking for Clonidine and Librium Taper because he states he wants to get off of Suboxone? Since no Psych Bed has been available and he is getting furstrated -Psych to Re-evaluate today   Hypokalemia -Improved. K+ is now 3.8 -Stopped KPhos Repletement -Mag Level was 1.8 -Continue to Monitor and Trend and Replete as Necessary  -Repeat CMP in AM   Abnormal LFT's -Patient's AST went from 84 -> 139 -> 101 -> 81 and ALT went from 104 -> 144 -> 120 -> 106 -Check RUQ U/S and Acute Hepatitis Panel -RUQ U/S showed Study within normal limits and Acute Hepatitis Panel showed HCV Ab >11.0 so will check RNA Quant and Genotype (Both pending)  -Continue to Monitor and Trend LFT's and repeat CMP in AM  -Given Gentle IVF Hydration with NS + 40 mEQ KCl at 75 mL/hr x12 hours and this has now stopped   Hyperbilirubinemia -Patient's T Bili went from 1.5 and is now 0.3 -Continue to Monitor and Trend and repeat CMP in AM   Opiate dependence with opiate-induced mood disorder: -Intermittently on Suboxone.  Now resumed and is on Buprenoprhine-Naloxone 8-2 mg 1 tab SL BID -States he wants to get off of Suboxone since no bed is available as of yet and wants to be on a Clonidine and Librium Taper for Withdrawals but will hold off on starting as disposition is still pending -Psych to re-evaluate this AM   Hyponatremia/Hypochloremia -Patient is sodium was mild  at 134 and Chloride is 97; Now Sodium was 137 and Chloride is 102 -Continue to monitor and trend;  -Encourage p.o. intake -Repeat CMP in a.m.  Right-sided Rib Pain, improved  -Worse with movement -We will try topical analgesics and obtain a chest x-ray; CXR Normal  -May consider NSAIDs specifically IV Ketorolac however recently just had an acute kidney injury so we will hold off for now  Normocytic Anemia -Patient's hemoglobin/hematocrit went from 13.8/43.0 -> 12.5/29.2 -> 11.9/37.7 -Likely dilutional drop from IV fluid that the patient was given yesterday -Checked Anemia Panel this AM and showed iron level 41, U IBC 297, TIBC 338, saturation ratios of 12%, ferritin level 16, folate level 22.6, vitamin B12 of 297 -Started Niferex her vitamins are daily -Continue to monitor for signs and symptoms of bleeding; currently no overt bleeding noted -Repeat CBC in a.m.  Currently Medically stable to transfer to inpatient Psych. Asking for Clonidine and Librium Taper Continue all suicide precautions and one-to-one sitter. He is volunteering to go to Psych. Will IVC if tries to leave only.   DVT prophylaxis: Enoxaparin 40 mg sq q24h Code Status: FULL CODE  Family Communication: No family present at bedside  Disposition Plan: D/C To Inpatient Psych when  bed is available; [facility]   Consultants:   Psychiatry    Procedures:  None  Antimicrobials:  Anti-infectives (From admission, onward)   None     Subjective: Seen and examined at bedside and he does not complain of any specific complaints and states that his rib pain was feeling better.  States that since his bed placement is taking longer than expected he does not want to be on Suboxone at Discharge and wants a Clonidine and Librium taper for withdrawals.  I spoke with him that we are not a detox center and will not be changed given his prior drug abuse and homelessness and that he  will need to be treated adequately by psychiatric services. Psychiatry will be re-evaluating him today.  [facility] reviewing the patient's case and psychiatric meds are still pending.  Objective: Vitals:   02/26/19 0054 02/26/19 0517 02/26/19 0712 02/26/19 1115  BP: 99/71 126/75 114/80 125/60  Pulse: 92 90 94 92  Resp: 20 16 16    Temp: (!) 97.5 F (36.4 C) 97.6 F (36.4 C) 98.7 F (37.1 C) 98.2 F (36.8 C)  TempSrc: Oral Oral Oral Oral  SpO2: 99% 100% 99% 98%  Weight:      Height:        Intake/Output Summary (Last 24 hours) at 02/26/2019 1206 Last data filed at 02/26/2019 1200 Gross per 24 hour  Intake 3510 ml  Output -  Net 3510 ml   Filed Weights   02/18/19 1534 02/18/19 2058  Weight: 78.9 kg 72.6 kg   Examination: Physical Exam:  Constitutional: Well-nourished, well-developed male currently no acute distress sitting up in the bed not having any complaints but wanting some detox medications with clonidine and Librium today Eyes: Lids and conjunctive are normal.  Sclera anicteric ENMT: External ears and nose appear normal.  Grossly normal hearing Neck: Appears supple no JVD Respiratory: Mildly diminished auscultation bilaterally no appreciable wheezing, rales, rhonchi.  Patient not tachypneic or using accessory muscles to breathe Cardiovascular: Slightly tachycardic rate but regular rhythm.  No appreciable murmurs, rubs, gallops.  No lower extremity edema noted Abdomen: Soft, nontender, nondistended bowel sounds present GU: Deferred Musculoskeletal: No contractures or cyanosis.  No joint deformities in the upper and lower extremities Skin: No appreciable rashes or lesions on his evaluation but does have multiple tattoos on his chest and face as well as a scar on his right pectoral muscle and some scars on his abdomen Neurologic: Cranial nerves II through XII gross intact no appreciable focal deficits Psychiatric: Normal judgment and insight.  Patient is  awake, alert, oriented.  He is anxious today but denies any suicidal ideation but states that he has thoughts of where he wants or himself occasionally  Data Reviewed: I have personally reviewed following labs and imaging studies  CBC: Recent Labs  Lab 02/24/19 0935 02/25/19 0505 02/26/19 0552  WBC 5.9 5.2 4.8  NEUTROABS 4.0 2.3 2.0  HGB 13.8 12.5* 11.9*  HCT 43.0 39.0 37.7*  MCV 91.7 90.7 94.0  PLT 228 PLATELET CLUMPS NOTED ON SMEAR, UNABLE TO ESTIMATE 232   Basic Metabolic Panel: Recent Labs  Lab 02/20/19 0730  02/22/19 0705 02/23/19 0643 02/24/19 0935 02/25/19 0505 02/26/19 0552  NA 135   < > 139 140 137 134* 137  K 3.6   < > 3.8 2.8* 3.9 4.2 3.8  CL 100   < > 101 98 98 97* 102  CO2 25   < >  29 32 28 27 27   GLUCOSE 445*   < > 242* 124* 174* 272* 235*  BUN 15   < > 9 21* 7 10 11   CREATININE 0.87   < > 0.82 1.56* 0.80 0.92 0.93  CALCIUM 8.2*   < > 8.7* 8.5* 9.2 8.7* 8.5*  MG 1.9  --   --   --  1.8 1.8 2.0  PHOS 2.8  --   --   --  4.4 4.0 4.8*   < > = values in this interval not displayed.   GFR: Estimated Creatinine Clearance: 113 mL/min (by C-G formula based on SCr of 0.93 mg/dL). Liver Function Tests: Recent Labs  Lab 02/24/19 0935 02/25/19 0505 02/26/19 0552  AST 139* 101* 81*  ALT 144* 120* 106*  ALKPHOS 59 55 48  BILITOT 0.5 0.1* 0.3  PROT 7.0 5.9* 5.8*  ALBUMIN 3.7 3.2* 3.1*   No results for input(s): LIPASE, AMYLASE in the last 168 hours. No results for input(s): AMMONIA in the last 168 hours. Coagulation Profile: No results for input(s): INR, PROTIME in the last 168 hours. Cardiac Enzymes: No results for input(s): CKTOTAL, CKMB, CKMBINDEX, TROPONINI in the last 168 hours. BNP (last 3 results) No results for input(s): PROBNP in the last 8760 hours. HbA1C: No results for input(s): HGBA1C in the last 72 hours. CBG: Recent Labs  Lab 02/25/19 1615 02/25/19 2058 02/25/19 2142 02/26/19 0737 02/26/19 1124  GLUCAP 94 59* 121* 257* 241*   Lipid  Profile: No results for input(s): CHOL, HDL, LDLCALC, TRIG, CHOLHDL, LDLDIRECT in the last 72 hours. Thyroid Function Tests: No results for input(s): TSH, T4TOTAL, FREET4, T3FREE, THYROIDAB in the last 72 hours. Anemia Panel: Recent Labs    02/26/19 0552  VITAMINB12 297  FOLATE 22.6  FERRITIN 16*  TIBC 338  IRON 41*  RETICCTPCT 1.2   Sepsis Labs: No results for input(s): PROCALCITON, LATICACIDVEN in the last 168 hours.  Recent Results (from the past 240 hour(s))  SARS Coronavirus 2 (CEPHEID - Performed in [place] hospital lab), Hosp Order     Status: None   Collection Time: 02/18/19  7:54 PM   Specimen: Nasopharyngeal Swab  Result Value Ref Range Status   SARS Coronavirus 2 NEGATIVE NEGATIVE Final    Comment: (NOTE) If result is NEGATIVE SARS-CoV-2 target nucleic acids are NOT DETECTED. The SARS-CoV-2 RNA is generally detectable in upper and lower  respiratory specimens during the acute phase of infection. The lowest  concentration of SARS-CoV-2 viral copies this assay can detect is 250  copies / mL. A negative result does not preclude SARS-CoV-2 infection  and should not be used as the sole basis for treatment or other  patient management decisions.  A negative result may occur with  improper specimen collection / handling, submission of specimen other  than nasopharyngeal swab, presence of viral mutation(s) within the  areas targeted by this assay, and inadequate number of viral copies  (<250 copies / mL). A negative result must be combined with clinical  observations, patient history, and epidemiological information. If result is POSITIVE SARS-CoV-2 target nucleic acids are DETECTED. The SARS-CoV-2 RNA is generally detectable in upper and lower  respiratory specimens dur ing the acute phase of infection.  Positive  results are indicative of active infection with SARS-CoV-2.  Clinical  correlation with patient history and other diagnostic information is  necessary  to determine patient infection status.  Positive results do  not rule out bacterial infection or co-infection with other viruses.  If result is PRESUMPTIVE POSTIVE SARS-CoV-2 nucleic acids MAY BE PRESENT.   A presumptive positive result was obtained on the submitted specimen  and confirmed on repeat testing.  While 2019 novel coronavirus  (SARS-CoV-2) nucleic acids may be present in the submitted sample  additional confirmatory testing may be necessary for epidemiological  and / or clinical management purposes  to differentiate between  SARS-CoV-2 and other Sarbecovirus currently known to infect humans.  If clinically indicated additional testing with an alternate test  methodology [phone] is advised. The SARS-CoV-2 RNA is generally  detectable in upper and lower respiratory sp ecimens during the acute  phase of infection. The expected result is Negative. Fact Sheet for Patients:  BoilerBrush.com.cyhttps://www.fda.gov/media/136312/download Fact Sheet for Healthcare Providers: https://pope.com/https://www.fda.gov/media/136313/download This test is not yet approved or cleared by the [place] FDA and has been authorized for detection and/or diagnosis of SARS-CoV-2 by FDA under an Emergency Use Authorization (EUA).  This EUA will remain in effect (meaning this test can be used) for the duration of the COVID-19 declaration under Section 564(b)(1) of the Act, 21 U.S.C. section 360bbb-3(b)(1), unless the authorization is terminated or revoked sooner. Performed at [facility], [address] [place], [place] [address]   MRSA PCR Screening     Status: None   Collection Time: 02/18/19  9:19 PM   Specimen: Nasal Mucosa; Nasopharyngeal  Result Value Ref Range Status   MRSA by PCR NEGATIVE NEGATIVE Final    Comment:        The GeneXpert MRSA Assay (FDA approved for NASAL specimens only), is one component of a comprehensive MRSA colonization surveillance program. It is not intended to diagnose MRSA  infection nor to guide or monitor treatment for MRSA infections. Performed at [facility], [address] [place], [place] [address]    Radiology Studies: Dg Chest Port 1 View  Result Date: 02/25/2019 CLINICAL DATA:  Right-sided rib pain beginning today. EXAM: PORTABLE CHEST 1 VIEW COMPARISON:  February 07, 2019 FINDINGS: The heart size and mediastinal contours are within normal limits. Both lungs are clear. The visualized skeletal structures are unremarkable. IMPRESSION: No active disease. Electronically Signed   By: Gerome Samavid  Williams III M.D   On: 02/25/2019 12:55   [place] Abdomen Limited Ruq  Result Date: 02/24/2019 CLINICAL DATA:  Abnormal liver enzymes EXAM: ULTRASOUND ABDOMEN LIMITED RIGHT UPPER QUADRANT COMPARISON:  None. FINDINGS: Gallbladder: No gallstones or wall thickening visualized. There is no pericholecystic fluid. No sonographic Murphy sign noted by sonographer. Common bile duct: Diameter: 7 mm, upper normal.  No biliary duct mass or calculus Liver: No focal lesion identified. Within normal limits in parenchymal echogenicity. Portal vein is patent on color Doppler imaging with normal direction of blood flow towards the liver. IMPRESSION: Study within normal limits. Electronically Signed   By: Bretta BangWilliam  Woodruff III M.D.   On: 02/24/2019 17:21   Scheduled Meds: . ARIPiprazole  5 mg Oral Daily  . buprenorphine-naloxone  1 tablet Sublingual BID  . buPROPion  300 mg Oral Daily  . diclofenac sodium  4 g Topical QID  . enoxaparin (LOVENOX) injection  40 mg Subcutaneous Q24H  . hydrocortisone cream   Topical BID  . insulin aspart  0-15 Units Subcutaneous TID WC  . insulin aspart protamine- aspart  42 Units Subcutaneous BID WC  . iron polysaccharides  150 mg Oral Daily  . sertraline  100 mg Oral Daily   Continuous Infusions:   LOS: 8 days   Merlene Laughtermair Latif , DO  [facility] PAGER is on [place]  If 7PM-7AM, please contact night-coverage [email or web address]  Password [facility] 02/26/2019, 12:06 PM

## 2019-02-26 NOTE — Progress Notes (Signed)
Pt discharged in stable condition. [place] instructions given. PT instructed that [facility] must [facility] to [facility] before 5pm today  to get medications. Cab voucher offered several times but pt declined. Pt informed of upcoming appt at [facility] and pt verbalized understanding. Pt ambulated off the unit

## 2019-02-26 NOTE — TOC Progression Note (Addendum)
Transition of Care [facility]) - Progression Note    Patient Details  Name: Donald Hartman MRN: [id number] Date of Birth: 10/19/1985  Transition of Care [facility]) CM/SW [place], [place] Phone Number: 02/26/2019, 7:58 AM  Clinical Narrative:    CSW completed [facility]  initial intake coordinator EJ. Patient information under review process.    Expected Discharge Plan: Psychiatric Hospital Barriers to Discharge: Homeless with medical needs, Inadequate or no insurance, Psych Bed not available  Expected Discharge Plan and Services Expected Discharge Plan: Psychiatric Hospital In-house Referral: Clinical Social Work Discharge Planning Services: NA   Living arrangements for the past 2 months: No permanent address, Homeless Expected Discharge Date: 02/23/19                                     Social Determinants of Health (SDOH) Interventions    Readmission Risk Interventions No flowsheet data found.

## 2019-02-28 ENCOUNTER — Inpatient Hospital Stay (HOSPITAL_COMMUNITY)
Admission: EM | Admit: 2019-02-28 | Discharge: 2019-03-04 | DRG: 639 | Disposition: A | Discharge status: Home / Self Care | Payer: Self-pay | Attending: Internal Medicine | Admitting: Internal Medicine

## 2019-02-28 ENCOUNTER — Other Ambulatory Visit: Payer: Self-pay

## 2019-02-28 DIAGNOSIS — T383X6A Underdosing of insulin and oral hypoglycemic [antidiabetic] drugs, initial encounter: Secondary | ICD-10-CM | POA: Diagnosis present

## 2019-02-28 DIAGNOSIS — E1010 Type 1 diabetes mellitus with ketoacidosis without coma: Principal | ICD-10-CM | POA: Diagnosis present

## 2019-02-28 DIAGNOSIS — Z20828 Contact with and (suspected) exposure to other viral communicable diseases: Secondary | ICD-10-CM

## 2019-02-28 DIAGNOSIS — F332 Major depressive disorder, recurrent severe without psychotic features: Secondary | ICD-10-CM | POA: Diagnosis present

## 2019-02-28 DIAGNOSIS — F4325 Adjustment disorder with mixed disturbance of emotions and conduct: Secondary | ICD-10-CM | POA: Diagnosis present

## 2019-02-28 DIAGNOSIS — K219 Gastro-esophageal reflux disease without esophagitis: Secondary | ICD-10-CM | POA: Diagnosis present

## 2019-02-28 DIAGNOSIS — S41111A Laceration without foreign body of right upper arm, initial encounter: Secondary | ICD-10-CM | POA: Diagnosis present

## 2019-02-28 DIAGNOSIS — Z915 Personal history of self-harm: Secondary | ICD-10-CM

## 2019-02-28 DIAGNOSIS — E1110 Type 2 diabetes mellitus with ketoacidosis without coma: Secondary | ICD-10-CM | POA: Diagnosis present

## 2019-02-28 DIAGNOSIS — F17200 Nicotine dependence, unspecified, uncomplicated: Secondary | ICD-10-CM | POA: Diagnosis present

## 2019-02-28 DIAGNOSIS — F1124 Opioid dependence with opioid-induced mood disorder: Secondary | ICD-10-CM | POA: Diagnosis present

## 2019-02-28 DIAGNOSIS — Z91128 Patient's intentional underdosing of medication regimen for other reason: Secondary | ICD-10-CM

## 2019-02-28 DIAGNOSIS — E876 Hypokalemia: Secondary | ICD-10-CM | POA: Diagnosis present

## 2019-02-28 DIAGNOSIS — Z9119 Patient's noncompliance with other medical treatment and regimen: Secondary | ICD-10-CM

## 2019-02-28 DIAGNOSIS — F1910 Other psychoactive substance abuse, uncomplicated: Secondary | ICD-10-CM | POA: Diagnosis present

## 2019-02-28 DIAGNOSIS — Z590 Homelessness: Secondary | ICD-10-CM

## 2019-02-28 DIAGNOSIS — X788XXA Intentional self-harm by other sharp object, initial encounter: Secondary | ICD-10-CM | POA: Diagnosis present

## 2019-02-28 DIAGNOSIS — Y92230 Patient room in hospital as the place of occurrence of the external cause: Secondary | ICD-10-CM | POA: Diagnosis present

## 2019-02-28 LAB — URINALYSIS, ROUTINE W REFLEX MICROSCOPIC
Bacteria, UA: NONE SEEN
Bilirubin Urine: NEGATIVE
Glucose, UA: >=500 mg/dL — AB
Hgb urine dipstick: NEGATIVE
Ketones, ur: 20 mg/dL — AB
Leukocytes,Ua: NEGATIVE
Nitrite: NEGATIVE
Protein, ur: NEGATIVE mg/dL
Specific Gravity, Urine: 1.025 (ref 1.005–1.030)
pH: 5 (ref 5.0–8.0)

## 2019-02-28 LAB — CBC
HCT: 36.7 % — ABNORMAL LOW (ref 39.0–52.0)
Hemoglobin: 12 g/dL — ABNORMAL LOW (ref 13.0–17.0)
MCH: 30.2 pg (ref 26.0–34.0)
MCHC: 32.7 g/dL (ref 30.0–36.0)
MCV: 92.2 fL (ref 80.0–100.0)
Platelets: 258 10*3/uL (ref 150–400)
RBC: 3.98 MIL/uL — ABNORMAL LOW (ref 4.22–5.81)
RDW: 15.5 % (ref 11.5–15.5)
WBC: 4.6 10*3/uL (ref 4.0–10.5)
nRBC: 0 % (ref 0.0–0.2)

## 2019-02-28 LAB — BASIC METABOLIC PANEL
Anion gap: 20 — ABNORMAL HIGH (ref 5–15)
BUN: 14 mg/dL (ref 6–20)
CO2: 20 mmol/L — ABNORMAL LOW (ref 22–32)
Calcium: 9 mg/dL (ref 8.9–10.3)
Chloride: 91 mmol/L — ABNORMAL LOW (ref 98–111)
Creatinine, Ser: 1.05 mg/dL (ref 0.61–1.24)
GFR calc Af Amer: >60 mL/min (ref >60)
GFR calc non Af Amer: >60 mL/min (ref >60)
Glucose, Bld: 796 mg/dL (ref 70–99)
Potassium: 4.7 mmol/L (ref 3.5–5.1)
Sodium: 131 mmol/L — ABNORMAL LOW (ref 135–145)

## 2019-02-28 LAB — SARS CORONAVIRUS 2 BY RT PCR (HOSPITAL ORDER, PERFORMED IN ~~LOC~~ HOSPITAL LAB): SARS Coronavirus 2: NEGATIVE

## 2019-02-28 LAB — CBG MONITORING, ED
Glucose-Capillary: >600 mg/dL (ref 70–99)
Glucose-Capillary: >600 mg/dL (ref 70–99)
Glucose-Capillary: 462 mg/dL — ABNORMAL HIGH (ref 70–99)
Glucose-Capillary: 283 mg/dL — ABNORMAL HIGH (ref 70–99)
Glucose-Capillary: 274 mg/dL — ABNORMAL HIGH (ref 70–99)

## 2019-02-28 MED ORDER — INSULIN REGULAR(HUMAN) IN NACL 100-0.9 UT/100ML-% IV SOLN: INTRAVENOUS | Status: DC

## 2019-02-28 MED ORDER — DEXTROSE-NACL 5-0.45 % IV SOLN: INTRAVENOUS | Status: DC

## 2019-02-28 MED ORDER — ENOXAPARIN SODIUM 40 MG/0.4ML ~~LOC~~ SOLN: 40.0000 mg | Freq: Every day | SUBCUTANEOUS | Status: DC

## 2019-02-28 MED ORDER — INSULIN REGULAR(HUMAN) IN NACL 100-0.9 UT/100ML-% IV SOLN
INTRAVENOUS | Status: DC
  Administered 2019-02-28: 19:00:00 5.4 [IU]/h via INTRAVENOUS
  Filled 2019-02-28: qty 100

## 2019-02-28 MED ORDER — POTASSIUM CHLORIDE 10 MEQ/100ML IV SOLN
10.0000 meq | INTRAVENOUS | Status: AC
  Administered 2019-03-01 (×2): 10 meq via INTRAVENOUS
  Filled 2019-02-28 (×2): qty 100

## 2019-02-28 MED ORDER — DEXTROSE-NACL 5-0.45 % IV SOLN
INTRAVENOUS | Status: DC
  Administered 2019-02-28 – 2019-03-01 (×2): via INTRAVENOUS

## 2019-02-28 MED ORDER — SODIUM CHLORIDE 0.9 % IV BOLUS
2000.0000 mL | Freq: Once | INTRAVENOUS | Status: AC
  Administered 2019-02-28: 2000 mL via INTRAVENOUS

## 2019-02-28 MED ORDER — SODIUM CHLORIDE 0.9 % IV SOLN: INTRAVENOUS | Status: DC

## 2019-02-28 NOTE — H&P (Signed)
History and Physical   Donald Hartman KYH:[id number] DOB: Sep 02, 1985 DOA: 02/28/2019  Referring MD/NP/PA: Dr. Jola Schmidt  PCP: Patient, No Pcp Per   Outpatient Specialists: None  Patient coming from: Homeless  Chief Complaint: Weakness and high blood sugar  HPI: Donald Hartman is a 33 y.o. male with medical history significant of type 1 diabetes who was recently admitted with diabetic ketoacidosis.  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  He was apparently on his way from [place] to [place] when he got off the train at [facility] with no medications has had multiple admissions in different hospitals for DKA since then.  Now he has decided to stay in [place].  Patient has had issues with suicidal ideation, abnormal behavior and aggressiveness.  Since he was discharged from the hospital he did not get his insulin.  He said he could not obtain it.  Came back again with blood sugar of about 800 with diabetic ketoacidosis.  He is having nausea vomiting with some abdominal discomfort.  Patient also having polyuria polydipsia and polyphagia.  Patient also has history of polysubstance abuse including injecting heroin.  He denied any suicidal or homicidal ideation at this point.  He is being readmitted with diabetic ketoacidosis.  ED Course: Temperature is 98.5 blood pressure 124/81, pulse of 107 respirate of 22 and oxygen sat 96% on room air.  Urinalysis shows glucosuria.  COVID-19 testing is negative.  Sodium is 131 potassium 4.7 chloride 91 CO2 20 with a glucose of 96.  BUN is 14 creatinine 1.09 his anion gap is now 20.  He has ketonuria.  CBC essentially unchanged.  He will be admitted for re-management of his DKA  Review of Systems: As per HPI otherwise 10 point review of systems negative.    Past Medical History:  Diagnosis Date  . Diabetes mellitus without complication (Donald Hartman)      No past surgical history on file.   reports that he has been smoking. He has never used smokeless tobacco. No history on file for alcohol and drug.  No Known Allergies  No family history on file.   Prior to Admission medications   Medication Sig Start Date End Date Taking? Authorizing Provider  buprenorphine (SUBUTEX) 8 MG SUBL SL tablet Place 8 mg under the tongue 2 (two) times a day.    Yes [provider]  blood glucose meter kit and supplies KIT Dispense based on patient and insurance preference. Use up to four times daily as directed. (FOR ICD-9 250.00, 250.01). 02/26/19   Raiford Noble Latif, DO  buPROPion (WELLBUTRIN XL) 300 MG 24 hr tablet Take 1 tablet (300 mg total) by mouth daily. 02/26/19   Sheikh, Georgina Quint Latif, DO  insulin aspart (NOVOLOG) 100 UNIT/ML injection Inject 0-20 Units into the skin 3 (three) times daily with meals. 02/26/19   Raiford Noble Latif, DO  insulin aspart protamine- aspart (NOVOLOG MIX 70/30) (70-30) 100 UNIT/ML injection Inject 0.42 mLs (42 Units total) into the skin 2 (two) times daily with a meal. 02/26/19 03/28/19  Sheikh, Omair Latif, DO  Insulin Syringe-Needle U-100 30G X 15/64" 0.5 ML MISC 1 Syringe by Does not apply route daily. 1 Insulin syringe-needle, 5 times daily 02/26/19   Raiford Noble Latif, DO  iron polysaccharides (NIFEREX) 150 MG capsule Take 1 capsule (150 mg total) by mouth daily. 02/26/19   Raiford Noble Latif, DO  sertraline (  ZOLOFT) 100 MG tablet Take 1 tablet (100 mg total) by mouth daily. 02/26/19   Raiford Noble Latif, DO  traZODone (DESYREL) 100 MG tablet Take 1 tablet (100 mg total) by mouth at bedtime as needed for sleep. 02/26/19   Kerney Elbe, DO    Physical Exam: Vitals:   02/28/19 1712 02/28/19 1823 02/28/19 1830  BP: 120/75  118/72  Pulse: (!) 107  (!) 102  Resp: 16  17  Temp: 98.5 F (36.9 C)    TempSrc: Oral    SpO2: 96%  96%  Weight:  72.6 kg   Height:  '5\' 9"'  (1.753 m)       Constitutional: NAD,  withdrawn Vitals:   02/28/19 1712 02/28/19 1823 02/28/19 1830  BP: 120/75  118/72  Pulse: (!) 107  (!) 102  Resp: 16  17  Temp: 98.5 F (36.9 C)    TempSrc: Oral    SpO2: 96%  96%  Weight:  72.6 kg   Height:  '5\' 9"'  (1.753 m)    Eyes: PERRL, lids and conjunctivae normal ENMT: Mucous membranes are dry. Posterior pharynx clear of any exudate or lesions.Normal dentition.  Neck: normal, supple, no masses, no thyromegaly Respiratory: clear to auscultation bilaterally, no wheezing, no crackles. Normal respiratory effort. No accessory muscle use.  Cardiovascular: Sinus tachycardia, no murmurs / rubs / gallops. No extremity edema. 2+ pedal pulses. No carotid bruits.  Abdomen: no tenderness, no masses palpated. No hepatosplenomegaly. Bowel sounds positive.  Musculoskeletal: no clubbing / cyanosis. No joint deformity upper and lower extremities. Good ROM, no contractures. Normal muscle tone.  Skin: no rashes, lesions, ulcers. No induration Neurologic: CN 2-12 grossly intact. Sensation intact, DTR normal. Strength 5/5 in all 4.  Psychiatric: Normal judgment and insight. Alert and oriented x 3.  Depressed mood.     Labs on Admission: I have personally reviewed following labs and imaging studies  CBC: Recent Labs  Lab 02/24/19 0935 02/25/19 0505 02/26/19 0552 02/28/19 1720  WBC 5.9 5.2 4.8 4.6  NEUTROABS 4.0 2.3 2.0  --   HGB 13.8 12.5* 11.9* 12.0*  HCT 43.0 39.0 37.7* 36.7*  MCV 91.7 90.7 94.0 92.2  PLT 228 PLATELET CLUMPS NOTED ON SMEAR, UNABLE TO ESTIMATE 232 161   Basic Metabolic Panel: Recent Labs  Lab 02/23/19 0643 02/24/19 0935 02/25/19 0505 02/26/19 0552 02/28/19 1720  NA 140 137 134* 137 131*  K 2.8* 3.9 4.2 3.8 4.7  CL 98 98 97* 102 91*  CO2 32 '28 27 27 ' 20*  GLUCOSE 124* 174* 272* 235* 796*  BUN 21* '7 10 11 14  ' CREATININE 1.56* 0.80 0.92 0.93 1.05  CALCIUM 8.5* 9.2 8.7* 8.5* 9.0  MG  --  1.8 1.8 2.0  --   PHOS  --  4.4 4.0 4.8*  --    GFR: Estimated  Creatinine Clearance: 100.1 mL/min (by C-G formula based on SCr of 1.05 mg/dL). Liver Function Tests: Recent Labs  Lab 02/24/19 0935 02/25/19 0505 02/26/19 0552  AST 139* 101* 81*  ALT 144* 120* 106*  ALKPHOS 59 55 48  BILITOT 0.5 0.1* 0.3  PROT 7.0 5.9* 5.8*  ALBUMIN 3.7 3.2* 3.1*   No results for input(s): LIPASE, AMYLASE in the last 168 hours. No results for input(s): AMMONIA in the last 168 hours. Coagulation Profile: No results for input(s): INR, PROTIME in the last 168 hours. Cardiac Enzymes: No results for input(s): CKTOTAL, CKMB, CKMBINDEX, TROPONINI in the last 168 hours. BNP (last 3 results) No results  for input(s): PROBNP in the last 8760 hours. HbA1C: No results for input(s): HGBA1C in the last 72 hours. CBG: Recent Labs  Lab 02/26/19 0737 02/26/19 1124 02/28/19 1715 02/28/19 1754 02/28/19 1920  GLUCAP 257* 241* >600* >600* 462*   Lipid Profile: No results for input(s): CHOL, HDL, LDLCALC, TRIG, CHOLHDL, LDLDIRECT in the last 72 hours. Thyroid Function Tests: No results for input(s): TSH, T4TOTAL, FREET4, T3FREE, THYROIDAB in the last 72 hours. Anemia Panel: Recent Labs    02/26/19 0552  VITAMINB12 297  FOLATE 22.6  FERRITIN 16*  TIBC 338  IRON 41*  RETICCTPCT 1.2   Urine analysis:    Component Value Date/Time   COLORURINE COLORLESS (A) 02/28/2019 1907   APPEARANCEUR CLEAR 02/28/2019 1907   LABSPEC 1.025 02/28/2019 1907   PHURINE 5.0 02/28/2019 1907   GLUCOSEU >=500 (A) 02/28/2019 1907   HGBUR NEGATIVE 02/28/2019 1907   BILIRUBINUR NEGATIVE 02/28/2019 1907   KETONESUR 20 (A) 02/28/2019 1907   PROTEINUR NEGATIVE 02/28/2019 1907   NITRITE NEGATIVE 02/28/2019 1907   LEUKOCYTESUR NEGATIVE 02/28/2019 1907   Sepsis Labs: '@LABRCNTIP' (procalcitonin:4,lacticidven:4) ) Recent Results (from the past 240 hour(s))  MRSA PCR Screening     Status: None   Collection Time: 02/18/19  9:19 PM   Specimen: Nasal Mucosa; Nasopharyngeal  Result Value Ref  Range Status   MRSA by PCR NEGATIVE NEGATIVE Final    Comment:        The GeneXpert MRSA Assay (FDA approved for NASAL specimens only), is one component of a comprehensive MRSA colonization surveillance program. It is not intended to diagnose MRSA infection nor to guide or monitor treatment for MRSA infections. Performed at [facility], [place] [address], [place], [place] [address]      Radiological Exams on Admission: No results found.    Assessment/Plan Principal Problem:   DKA (diabetic ketoacidoses) (HCC) Active Problems:   MDD (major depressive disorder), recurrent episode, severe ([place])   Opioid dependence with opioid-induced mood disorder ([place])     #1 DKA: Secondary to not using insulin since discharge 2 days ago.  Patient has gradually gotten worse.  We will readmit and started on IV insulin IV fluids and follow the DKA protocol.  Transition to his subcutaneous insulin once gap has closed.  Again reengage social worker on patient's outpatient treatment.  #2 polysubstance abuse: We will order urine drug screen.  Patient denied using drugs since leaving the hospital.  He had is homeless however unknown to be abusing heroine  #3 mood disorder: Has had previous psychiatric hospital hospitalization.  We will monitor him.  He was seen by psychiatry during last hospitalization.  No suicidal or homicidal ideation at this point.   DVT prophylaxis: Lovenox Code Status: Full code Family Communication: No family to discuss with Disposition Plan: Social services involvement.  Patient is homeless Consults called: None Admission status: Inpatient  Severity of Illness: The appropriate patient status for this patient is INPATIENT. Inpatient status is judged to be reasonable and necessary in order to provide the required intensity of service to ensure the patient's safety. The patient's presenting symptoms, physical exam findings, and initial radiographic and  laboratory data in the context of their chronic comorbidities is felt to place them at high risk for further clinical deterioration. Furthermore, it is not anticipated that the patient will be medically stable for discharge from the hospital within 2 midnights of admission. The following factors support the patient status of inpatient.   " The patient's presenting symptoms include  abdominal pain no nausea. " The worrisome physical exam findings include dry mucous membrane. " The initial radiographic and laboratory data are worrisome because of DKA. " The chronic co-morbidities include type 1 diabetes with noncompliance.   * I certify that at the point of admission it is my clinical judgment that the patient will require inpatient hospital care spanning beyond 2 midnights from the point of admission due to high intensity of service, high risk for further deterioration and high frequency of surveillance required.Barbette Merino MD [facility] Pager [phone]  If 7PM-7AM, please contact night-coverage [email or web address] Password [facility]  02/28/2019, 8:12 PM

## 2019-02-28 NOTE — ED Triage Notes (Signed)
Pt here for hyperglycemia. Type one diabetic and has not had insulin in three days. Pt endorses dizziness, polydipsia, nausea. Pt does not have a glucometer so he does not know what his CBG has been.

## 2019-02-28 NOTE — ED Notes (Signed)
ED TO INPATIENT HANDOFF REPORT  ED Nurse Name and Phone #: Wagner  S Name/Age/Gender Donald Hartman 33 y.o. male Room/Bed: 027C/027C  Code Status   Code Status: Prior  Home/SNF/Other Home Patient oriented to: self, place, time and situation Is this baseline? Yes   Triage Complete: Triage complete  Chief Complaint blood sugar high  Triage Note Pt here for hyperglycemia. Type one diabetic and has not had insulin in three days. Pt endorses dizziness, polydipsia, nausea. Pt does not have a glucometer so he does not know what his CBG has been.    Allergies No Known Allergies  Level of Care/Admitting Diagnosis ED Disposition    ED Disposition Condition [facility] Area: [place] [[id number]]  Level of Care: Progressive [102]  Covid Evaluation: Asymptomatic Screening Protocol (No Symptoms)  Diagnosis: DKA (diabetic ketoacidoses) ([place]) [235573]  Admitting Physician: Elwyn Reach [[id number]]  Attending Physician: Elwyn Reach [[id number]]  Estimated length of stay: past midnight tomorrow  Certification:: I certify this patient will need inpatient services for at least 2 midnights  PT Class (Do Not Modify): Inpatient [101]  PT Acc Code (Do Not Modify): Private [1]       B Medical/Surgery History Past Medical History:  Diagnosis Date  . Diabetes mellitus without complication ([place])    No past surgical history on file.   A IV Location/Drains/Wounds Patient Lines/Drains/Airways Status   Active Line/Drains/Airways    Name:   Placement date:   Placement time:   Site:   Days:   Peripheral IV 02/28/19 Right Antecubital   02/28/19    1804    Antecubital   less than 1          Intake/Output Last 24 hours  Intake/Output Summary (Last 24 hours) at 02/28/2019 2201 Last data filed at 02/28/2019 1945 Gross per 24 hour  Intake 2000 ml  Output -  Net 2000 ml    Labs/Imaging Results for orders placed or performed during the hospital  encounter of 02/28/19 (from the past 48 hour(s))  CBG monitoring, ED     Status: Abnormal   Collection Time: 02/28/19  5:15 PM  Result Value Ref Range   Glucose-Capillary >600 (HH) 70 - 99 mg/dL  Basic metabolic panel     Status: Abnormal   Collection Time: 02/28/19  5:20 PM  Result Value Ref Range   Sodium 131 (L) 135 - 145 mmol/L    Comment: LIPEMIC SPECIMEN   Potassium 4.7 3.5 - 5.1 mmol/L    Comment: SLIGHT HEMOLYSIS   Chloride 91 (L) 98 - 111 mmol/L   CO2 20 (L) 22 - 32 mmol/L   Glucose, Bld 796 (HH) 70 - 99 mg/dL    Comment: CRITICAL RESULT CALLED TO, READ BACK BY AND VERIFIED WITH: K.RAND,RN @ 1805 02/28/2019 WEBBERJ    BUN 14 6 - 20 mg/dL   Creatinine, Ser 1.05 0.61 - 1.24 mg/dL   Calcium 9.0 8.9 - 10.3 mg/dL   GFR calc non Af Amer >60 >60 mL/min   GFR calc Af Amer >60 >60 mL/min   Anion gap 20 (H) 5 - 15    Comment: Performed at [facility] [address], [place] [address]  CBC     Status: Abnormal   Collection Time: 02/28/19  5:20 PM  Result Value Ref Range   WBC 4.6 4.0 - 10.5 K/uL   RBC 3.98 (L) 4.22 - 5.81 MIL/uL   Hemoglobin 12.0 (L)  13.0 - 17.0 g/dL   HCT 98.136.7 (L) 19.139.0 - 47.852.0 %   MCV 92.2 80.0 - 100.0 fL   MCH 30.2 26.0 - 34.0 pg   MCHC 32.7 30.0 - 36.0 g/dL   RDW 29.515.5 62.111.5 - 30.815.5 %   Platelets 258 150 - 400 K/uL   nRBC 0.0 0.0 - 0.2 %    Comment: Performed at [facility], [address] [place], [place] [address]  CBG monitoring, ED     Status: Abnormal   Collection Time: 02/28/19  5:54 PM  Result Value Ref Range   Glucose-Capillary >600 (HH) 70 - 99 mg/dL   Comment 1 Notify RN    Comment 2 Document in Chart   Urinalysis, Routine w reflex microscopic     Status: Abnormal   Collection Time: 02/28/19  7:07 PM  Result Value Ref Range   Color, Urine COLORLESS (A) YELLOW   APPearance CLEAR CLEAR   Specific Gravity, Urine 1.025 1.005 - 1.030   pH 5.0 5.0 - 8.0   Glucose, UA >=500 (A) NEGATIVE mg/dL   Hgb urine dipstick NEGATIVE  NEGATIVE   Bilirubin Urine NEGATIVE NEGATIVE   Ketones, ur 20 (A) NEGATIVE mg/dL   Protein, ur NEGATIVE NEGATIVE mg/dL   Nitrite NEGATIVE NEGATIVE   Leukocytes,Ua NEGATIVE NEGATIVE   RBC / HPF 0-5 0 - 5 RBC/hpf   WBC, UA 0-5 0 - 5 WBC/hpf   Bacteria, UA NONE SEEN NONE SEEN    Comment: Performed at [facility], [address] [place], [place] [address]  CBG monitoring, ED     Status: Abnormal   Collection Time: 02/28/19  7:20 PM  Result Value Ref Range   Glucose-Capillary 462 (H) 70 - 99 mg/dL  SARS Coronavirus 2 (CEPHEID - Performed in [facility] hospital lab), Hosp Order     Status: None   Collection Time: 02/28/19  8:25 PM   Specimen: Nasopharyngeal Swab  Result Value Ref Range   SARS Coronavirus 2 NEGATIVE NEGATIVE    Comment: (NOTE) If result is NEGATIVE SARS-CoV-2 target nucleic acids are NOT DETECTED. The SARS-CoV-2 RNA is generally detectable in upper and lower  respiratory specimens during the acute phase of infection. The lowest  concentration of SARS-CoV-2 viral copies this assay can detect is 250  copies / mL. A negative result does not preclude SARS-CoV-2 infection  and should not be used as the sole basis for treatment or other  patient management decisions.  A negative result may occur with  improper specimen collection / handling, submission of specimen other  than nasopharyngeal swab, presence of viral mutation(s) within the  areas targeted by this assay, and inadequate number of viral copies  (<250 copies / mL). A negative result must be combined with clinical  observations, patient history, and epidemiological information. If result is POSITIVE SARS-CoV-2 target nucleic acids are DETECTED. The SARS-CoV-2 RNA is generally detectable in upper and lower  respiratory specimens dur ing the acute phase of infection.  Positive  results are indicative of active infection with SARS-CoV-2.  Clinical  correlation with patient history and other diagnostic  information is  necessary to determine patient infection status.  Positive results do  not rule out bacterial infection or co-infection with other viruses. If result is PRESUMPTIVE POSTIVE SARS-CoV-2 nucleic acids MAY BE PRESENT.   A presumptive positive result was obtained on the submitted specimen  and confirmed on repeat testing.  While 2019 novel coronavirus  (SARS-CoV-2) nucleic acids may be  present in the submitted sample  additional confirmatory testing may be necessary for epidemiological  and / or clinical management purposes  to differentiate between  SARS-CoV-2 and other Sarbecovirus currently known to infect humans.  If clinically indicated additional testing with an alternate test  methodology [id number]) is advised. The SARS-CoV-2 RNA is generally  detectable in upper and lower respiratory sp ecimens during the acute  phase of infection. The expected result is Negative. Fact Sheet for Patients:  BoilerBrush.com.cyhttps://www.fda.gov/media/136312/download Fact Sheet for Healthcare Providers: https://pope.com/https://www.fda.gov/media/136313/download This test is not yet approved or cleared by the [place] FDA and has been authorized for detection and/or diagnosis of SARS-CoV-2 by FDA under an Emergency Use Authorization (EUA).  This EUA will remain in effect (meaning this test can be used) for the duration of the COVID-19 declaration under Section 564(b)(1) of the Act, 21 U.S.C. section 360bbb-3(b)(1), unless the authorization is terminated or revoked sooner. Performed at [facility], [address] [place], [place] [address]   CBG monitoring, ED     Status: Abnormal   Collection Time: 02/28/19  8:55 PM  Result Value Ref Range   Glucose-Capillary 283 (H) 70 - 99 mg/dL   No results found.  Pending Labs [employer] Labs (From admission, onward)    Start     Ordered   Signed and Held  CBC  (enoxaparin (LOVENOX)    CrCl >/= 30 ml/min)  Once,   R    Comments: Baseline for enoxaparin therapy  IF NOT ALREADY DRAWN.  Notify MD if PLT < 100 K.    Signed and Held   Signed and Held  Creatinine, serum  (enoxaparin (LOVENOX)    CrCl >/= 30 ml/min)  Once,   R    Comments: Baseline for enoxaparin therapy IF NOT ALREADY DRAWN.    Signed and Held   Signed and Held  Creatinine, serum  (enoxaparin (LOVENOX)    CrCl >/= 30 ml/min)  Weekly,   R    Comments: while on enoxaparin therapy    Signed and Held          Vitals/Pain Today's Vitals   02/28/19 1712 02/28/19 1823 02/28/19 1830 02/28/19 2145  BP: 120/75  118/72 125/75  Pulse: (!) 107  (!) 102   Resp: 16  17 (!) 22  Temp: 98.5 F (36.9 C)     TempSrc: Oral     SpO2: 96%  96% 97%  Weight:  72.6 kg    Height:  5\' 9"  (1.753 m)    PainSc:        Isolation Precautions No active isolations  Medications Medications  dextrose 5 %-0.45 % sodium chloride infusion ( Intravenous Hold 02/28/19 1830)  insulin regular, human (MYXREDLIN) 100 units/ 100 mL infusion (2.2 Units/hr Intravenous Rate/Dose Change 02/28/19 2116)  sodium chloride 0.9 % bolus 2,000 mL (0 mLs Intravenous Stopped 02/28/19 1945)    Mobility walks Low fall risk   Focused Assessments Neuro Assessment Handoff:  Swallow screen pass? Yes          Neuro Assessment: Within Defined Limits Neuro Checks:      Last Documented NIHSS Modified Score:   Has TPA been given? No If patient is a Neuro Trauma and patient is going to OR before floor call report to 4N Charge nurse: [phone] or [phone]     R Recommendations: See Admitting Provider Note  Report given to:   Additional Notes: has hx of AMA but is cooperative at this time

## 2019-02-28 NOTE — ED Provider Notes (Signed)
[facility] EMERGENCY DEPARTMENT Provider Note   CSN: [id number] Arrival date & time: 02/28/19  1651     History   Chief Complaint Chief Complaint  Patient presents with  . Hyperglycemia    HPI Ayuub Penley is a 33 y.o. male.     HPI 33 year old male who was recently discharged from the hospital 2 days ago for diabetic ketoacidosis.  He is in an unfortunate situation in which he has no access to funds and is homeless.  He did not fill his insulin.  He returns to the emergency department today with hyperglycemia nausea vomiting and appears to be in diabetic ketoacidosis again.  He denies diarrhea.  No fevers or chills.   Past Medical History:  Diagnosis Date  . Diabetes mellitus without complication [facility])     Patient Active Problem List   Diagnosis Date Noted  . Substance induced mood disorder ([place])   . DKA, type 1 ([place]) 02/18/2019  . DKA (diabetic ketoacidoses) ([place]) 02/18/2019  . Opioid dependence with opioid-induced mood disorder ([place])   . MDD (major depressive disorder), recurrent episode, severe ([place]) 01/31/2019    No past surgical history on file.      Home Medications    Prior to Admission medications   Medication Sig Start Date End Date Taking? Authorizing Provider  buprenorphine (SUBUTEX) 8 MG SUBL SL tablet Place 8 mg under the tongue 2 (two) times a day.    Yes [provider]  blood glucose meter kit and supplies KIT Dispense based on patient and insurance preference. Use up to four times daily as directed. (FOR ICD-9 250.00, 250.01). 02/26/19   Raiford Noble Latif, DO  buPROPion (WELLBUTRIN XL) 300 MG 24 hr tablet Take 1 tablet (300 mg total) by mouth daily. 02/26/19   Sheikh, Georgina Quint Latif, DO  insulin aspart (NOVOLOG) 100 UNIT/ML injection Inject 0-20 Units into the skin 3 (three) times daily with meals. 02/26/19   Raiford Noble Latif, DO  insulin aspart protamine- aspart (NOVOLOG MIX 70/30) (70-30) 100 UNIT/ML injection Inject  0.42 mLs (42 Units total) into the skin 2 (two) times daily with a meal. 02/26/19 03/28/19  Sheikh, Omair Latif, DO  Insulin Syringe-Needle U-100 30G X 15/64" 0.5 ML MISC 1 Syringe by Does not apply route daily. 1 Insulin syringe-needle, 5 times daily 02/26/19   Raiford Noble Latif, DO  iron polysaccharides (NIFEREX) 150 MG capsule Take 1 capsule (150 mg total) by mouth daily. 02/26/19   Raiford Noble Latif, DO  sertraline (ZOLOFT) 100 MG tablet Take 1 tablet (100 mg total) by mouth daily. 02/26/19   Raiford Noble Latif, DO  traZODone (DESYREL) 100 MG tablet Take 1 tablet (100 mg total) by mouth at bedtime as needed for sleep. 02/26/19   Kerney Elbe, DO    Family History No family history on file.  Social History Social History   Tobacco Use  . Smoking status: Current Every Day Smoker  . Smokeless tobacco: Never Used  Substance Use Topics  . Alcohol use: Not on file  . Drug use: Not on file     Allergies   Patient has no known allergies.   Review of Systems Review of Systems  All other systems reviewed and are negative.    Physical Exam Updated Vital Signs BP 118/72   Pulse (!) 102   Temp 98.5 F (36.9 C) (Oral)   Resp 17   Ht '5\' 9"'  (1.753 m)   Wt 72.6 kg   SpO2 96%  BMI 23.64 kg/m   Physical Exam Vitals signs and nursing note reviewed.  Constitutional:      Appearance: He is well-developed.  HENT:     Head: Normocephalic and atraumatic.  Neck:     Musculoskeletal: Normal range of motion.  Cardiovascular:     Rate and Rhythm: Regular rhythm. Tachycardia present.     Heart sounds: Normal heart sounds.  Pulmonary:     Effort: Pulmonary effort is normal. No respiratory distress.     Breath sounds: Normal breath sounds.  Abdominal:     General: There is no distension.     Palpations: Abdomen is soft.     Tenderness: There is no abdominal tenderness.  Musculoskeletal: Normal range of motion.  Skin:    General: Skin is warm and dry.  Neurological:      Mental Status: He is alert and oriented to person, place, and time.  Psychiatric:        Judgment: Judgment normal.      ED Treatments / Results  Labs (all labs ordered are listed, but only abnormal results are displayed) Labs Reviewed  BASIC METABOLIC PANEL - Abnormal; Notable for the following components:      Result Value   Sodium 131 (*)    Chloride 91 (*)    CO2 20 (*)    Glucose, Bld 796 (*)    Anion gap 20 (*)    All other components within normal limits  CBC - Abnormal; Notable for the following components:   RBC 3.98 (*)    Hemoglobin 12.0 (*)    HCT 36.7 (*)    All other components within normal limits  URINALYSIS, ROUTINE W REFLEX MICROSCOPIC - Abnormal; Notable for the following components:   Color, Urine COLORLESS (*)    Glucose, UA >=500 (*)    Ketones, ur 20 (*)    All other components within normal limits  CBG MONITORING, ED - Abnormal; Notable for the following components:   Glucose-Capillary >600 (*)    All other components within normal limits  CBG MONITORING, ED - Abnormal; Notable for the following components:   Glucose-Capillary >600 (*)    All other components within normal limits  CBG MONITORING, ED - Abnormal; Notable for the following components:   Glucose-Capillary 462 (*)    All other components within normal limits  SARS CORONAVIRUS 2 (HOSPITAL ORDER, [place] [facility])    EKG None  Radiology No results found.  Procedures .Critical Care Performed by: Jola Schmidt, MD Authorized by: Jola Schmidt, MD   Critical care provider statement:    Critical care time (minutes):  33   Critical care was time spent personally by me on the following activities:  Discussions with consultants, evaluation of patient's response to treatment, examination of patient, ordering and performing treatments and interventions, ordering and review of laboratory studies, ordering and review of radiographic studies, pulse oximetry,  re-evaluation of patient's condition, obtaining history from patient or surrogate and review of old charts   (including critical care time)  Medications Ordered in ED Medications  dextrose 5 %-0.45 % sodium chloride infusion ( Intravenous Hold 02/28/19 1830)  insulin regular, human (MYXREDLIN) 100 units/ 100 mL infusion (5.4 Units/hr Intravenous New Bag/Given 02/28/19 1830)  sodium chloride 0.9 % bolus 2,000 mL (2,000 mLs Intravenous New Bag/Given 02/28/19 1829)     Initial Impression / Assessment and Plan / ED Course  I have reviewed the triage vital signs and the nursing notes.  Pertinent labs &  imaging results that were available during my care of the patient were reviewed by me and considered in my medical decision making (see chart for details).       Recurrent diabetic ketoacidosis in the setting of noncompliance and multiple social constraints.  IV fluid bolus.  Fluid resuscitation.  IV insulin drip.  Abdominal exam is benign.  Final Clinical Impressions(s) / ED Diagnoses   Final diagnoses:  Type 1 diabetes mellitus with ketoacidosis without coma [facility])    ED Discharge Orders    None       Jola Schmidt, MD 02/28/19 [phone]

## 2019-03-01 LAB — GLUCOSE, CAPILLARY
Glucose-Capillary: 136 mg/dL — ABNORMAL HIGH (ref 70–99)
Glucose-Capillary: 153 mg/dL — ABNORMAL HIGH (ref 70–99)
Glucose-Capillary: 191 mg/dL — ABNORMAL HIGH (ref 70–99)
Glucose-Capillary: 216 mg/dL — ABNORMAL HIGH (ref 70–99)
Glucose-Capillary: 233 mg/dL — ABNORMAL HIGH (ref 70–99)
Glucose-Capillary: 265 mg/dL — ABNORMAL HIGH (ref 70–99)
Glucose-Capillary: 287 mg/dL — ABNORMAL HIGH (ref 70–99)
Glucose-Capillary: 315 mg/dL — ABNORMAL HIGH (ref 70–99)
Glucose-Capillary: 323 mg/dL — ABNORMAL HIGH (ref 70–99)
Glucose-Capillary: 81 mg/dL (ref 70–99)
Glucose-Capillary: 82 mg/dL (ref 70–99)
Glucose-Capillary: 92 mg/dL (ref 70–99)

## 2019-03-01 LAB — CBC
HCT: 34.1 % — ABNORMAL LOW (ref 39.0–52.0)
Hemoglobin: 11.3 g/dL — ABNORMAL LOW (ref 13.0–17.0)
MCH: 29.4 pg (ref 26.0–34.0)
MCHC: 33.1 g/dL (ref 30.0–36.0)
MCV: 88.8 fL (ref 80.0–100.0)
Platelets: 228 10*3/uL (ref 150–400)
RBC: 3.84 MIL/uL — ABNORMAL LOW (ref 4.22–5.81)
RDW: 15.6 % — ABNORMAL HIGH (ref 11.5–15.5)
WBC: 4.7 10*3/uL (ref 4.0–10.5)
nRBC: 0 % (ref 0.0–0.2)

## 2019-03-01 LAB — RAPID URINE DRUG SCREEN, HOSP PERFORMED
Amphetamines: NOT DETECTED
Barbiturates: NOT DETECTED
Benzodiazepines: NOT DETECTED
Cocaine: NOT DETECTED
Opiates: NOT DETECTED
Tetrahydrocannabinol: POSITIVE — AB

## 2019-03-01 LAB — BASIC METABOLIC PANEL
Anion gap: 8 (ref 5–15)
Anion gap: 11 (ref 5–15)
BUN: 10 mg/dL (ref 6–20)
BUN: 12 mg/dL (ref 6–20)
CO2: 28 mmol/L (ref 22–32)
CO2: 25 mmol/L (ref 22–32)
Calcium: 8.4 mg/dL — ABNORMAL LOW (ref 8.9–10.3)
Calcium: 8.2 mg/dL — ABNORMAL LOW (ref 8.9–10.3)
Chloride: 104 mmol/L (ref 98–111)
Chloride: 102 mmol/L (ref 98–111)
Creatinine, Ser: 0.85 mg/dL (ref 0.61–1.24)
Creatinine, Ser: 0.85 mg/dL (ref 0.61–1.24)
GFR calc Af Amer: >60 mL/min (ref >60)
GFR calc Af Amer: >60 mL/min (ref >60)
GFR calc non Af Amer: >60 mL/min (ref >60)
GFR calc non Af Amer: >60 mL/min (ref >60)
Glucose, Bld: 100 mg/dL — ABNORMAL HIGH (ref 70–99)
Glucose, Bld: 277 mg/dL — ABNORMAL HIGH (ref 70–99)
Potassium: 3.7 mmol/L (ref 3.5–5.1)
Potassium: 4.1 mmol/L (ref 3.5–5.1)
Sodium: 140 mmol/L (ref 135–145)
Sodium: 138 mmol/L (ref 135–145)

## 2019-03-01 MED ORDER — INSULIN ASPART 100 UNIT/ML ~~LOC~~ SOLN
0.0000 [IU] | Freq: Three times a day (TID) | SUBCUTANEOUS | Status: DC
  Administered 2019-03-01: 11 [IU] via SUBCUTANEOUS
  Administered 2019-03-02: 17:00:00 3 [IU] via SUBCUTANEOUS
  Administered 2019-03-02 – 2019-03-03 (×3): 5 [IU] via SUBCUTANEOUS
  Administered 2019-03-03 – 2019-03-04 (×2): 2 [IU] via SUBCUTANEOUS
  Administered 2019-03-04: 8 [IU] via SUBCUTANEOUS

## 2019-03-01 MED ORDER — CALCIUM CARBONATE ANTACID 500 MG PO CHEW: 1.0000 | CHEWABLE_TABLET | Freq: Every day | ORAL | Status: DC | PRN

## 2019-03-01 MED ORDER — ARIPIPRAZOLE 5 MG PO TABS
5.0000 mg | ORAL_TABLET | Freq: Every day | ORAL | Status: DC
  Administered 2019-03-01 – 2019-03-04 (×4): 5 mg via ORAL
  Filled 2019-03-01 (×4): qty 1

## 2019-03-01 MED ORDER — ONDANSETRON HCL 4 MG/2ML IJ SOLN
4.0000 mg | Freq: Four times a day (QID) | INTRAMUSCULAR | Status: DC | PRN
  Administered 2019-03-01: 4 mg via INTRAVENOUS
  Filled 2019-03-01: qty 2

## 2019-03-01 MED ORDER — TRAZODONE HCL 50 MG PO TABS
100.0000 mg | ORAL_TABLET | Freq: Every evening | ORAL | Status: DC | PRN
  Administered 2019-03-02 (×2): 100 mg via ORAL
  Filled 2019-03-01 (×2): qty 2

## 2019-03-01 MED ORDER — BUPRENORPHINE HCL-NALOXONE HCL 8-2 MG SL SUBL
1.0000 | SUBLINGUAL_TABLET | Freq: Two times a day (BID) | SUBLINGUAL | Status: DC
  Administered 2019-03-01 – 2019-03-04 (×6): 1 via SUBLINGUAL
  Filled 2019-03-01 (×6): qty 1

## 2019-03-01 MED ORDER — INSULIN ASPART 100 UNIT/ML ~~LOC~~ SOLN
0.0000 [IU] | SUBCUTANEOUS | Status: DC
  Administered 2019-03-01: 8 [IU] via SUBCUTANEOUS

## 2019-03-01 MED ORDER — POTASSIUM CHLORIDE 10 MEQ/100ML IV SOLN: 10.0000 meq | INTRAVENOUS | Status: DC

## 2019-03-01 MED ORDER — INSULIN REGULAR(HUMAN) IN NACL 100-0.9 UT/100ML-% IV SOLN: INTRAVENOUS | Status: DC

## 2019-03-01 MED ORDER — SODIUM CHLORIDE 0.9 % IV SOLN: INTRAVENOUS | Status: DC

## 2019-03-01 MED ORDER — BUPROPION HCL ER (XL) 150 MG PO TB24
300.0000 mg | ORAL_TABLET | Freq: Every day | ORAL | Status: DC
  Administered 2019-03-01 – 2019-03-03 (×3): 300 mg via ORAL
  Filled 2019-03-01 (×3): qty 2

## 2019-03-01 MED ORDER — DEXTROSE-NACL 5-0.45 % IV SOLN: INTRAVENOUS | Status: DC

## 2019-03-01 MED ORDER — INSULIN ASPART PROT & ASPART (70-30 MIX) 100 UNIT/ML ~~LOC~~ SUSP
20.0000 [IU] | Freq: Two times a day (BID) | SUBCUTANEOUS | Status: DC
  Filled 2019-03-01: qty 10

## 2019-03-01 MED ORDER — PANTOPRAZOLE SODIUM 40 MG PO TBEC
40.0000 mg | DELAYED_RELEASE_TABLET | Freq: Two times a day (BID) | ORAL | Status: DC
  Administered 2019-03-01 – 2019-03-04 (×6): 40 mg via ORAL
  Filled 2019-03-01 (×6): qty 1

## 2019-03-01 MED ORDER — SERTRALINE HCL 100 MG PO TABS
100.0000 mg | ORAL_TABLET | Freq: Every day | ORAL | Status: DC
  Administered 2019-03-01 – 2019-03-04 (×4): 100 mg via ORAL
  Filled 2019-03-01 (×4): qty 1

## 2019-03-01 MED ORDER — INSULIN GLARGINE 100 UNIT/ML ~~LOC~~ SOLN
15.0000 [IU] | Freq: Every day | SUBCUTANEOUS | Status: DC
  Administered 2019-03-01: 08:00:00 15 [IU] via SUBCUTANEOUS
  Filled 2019-03-01: qty 0.15

## 2019-03-01 MED ORDER — INSULIN ASPART 100 UNIT/ML ~~LOC~~ SOLN: 0.0000 [IU] | Freq: Three times a day (TID) | SUBCUTANEOUS | Status: DC

## 2019-03-01 MED ORDER — INSULIN GLARGINE 100 UNIT/ML ~~LOC~~ SOLN
15.0000 [IU] | Freq: Every day | SUBCUTANEOUS | Status: DC
  Filled 2019-03-01: qty 0.15

## 2019-03-01 MED ORDER — INSULIN ASPART PROT & ASPART (70-30 MIX) 100 UNIT/ML ~~LOC~~ SUSP
35.0000 [IU] | Freq: Two times a day (BID) | SUBCUTANEOUS | Status: DC
  Administered 2019-03-01 – 2019-03-04 (×6): 35 [IU] via SUBCUTANEOUS
  Filled 2019-03-01: qty 10

## 2019-03-01 MED ORDER — ENOXAPARIN SODIUM 40 MG/0.4ML ~~LOC~~ SOLN
40.0000 mg | SUBCUTANEOUS | Status: DC
  Filled 2019-03-01: qty 0.4

## 2019-03-01 MED ORDER — BUPRENORPHINE HCL-NALOXONE HCL 8-2 MG SL SUBL
1.0000 | SUBLINGUAL_TABLET | Freq: Every day | SUBLINGUAL | Status: DC
  Administered 2019-03-01: 10:00:00 1 via SUBLINGUAL
  Filled 2019-03-01: qty 1

## 2019-03-01 MED ORDER — INSULIN REGULAR BOLUS VIA INFUSION
0.0000 [IU] | Freq: Three times a day (TID) | INTRAVENOUS | Status: DC
  Filled 2019-03-01: qty 10

## 2019-03-01 MED ORDER — DEXTROSE 50 % IV SOLN: 25.0000 mL | INTRAVENOUS | Status: DC | PRN

## 2019-03-01 MED ORDER — INSULIN GLARGINE 100 UNIT/ML ~~LOC~~ SOLN: 10.0000 [IU] | Freq: Every day | SUBCUTANEOUS | Status: DC

## 2019-03-01 MED FILL — BUPROPION HCL XL 300 MG TAB: 300 | 30 days supply | Qty: 30 | Fill #0

## 2019-03-01 MED FILL — !NOVOLOG 100UNITS/ML VIAL: 100/ML | 16 days supply | Qty: 10 | Fill #0

## 2019-03-01 MED FILL — SERTRALINE HCL 100 MG TAB: 100 | 30 days supply | Qty: 30 | Fill #0

## 2019-03-01 MED FILL — traZODone HCL 100 MG TABS: 100 | 30 days supply | Qty: 30 | Fill #0

## 2019-03-01 NOTE — Progress Notes (Addendum)
Robin in lab called to report serum blood glucose is 100.  I asked ehat anion gap is and reported as 8.  Insulin gtt is currently on hold per Glucose Stabilizer,   Will report to MD anion gap so I can transition pt to subcutaneous insulin.  MD did not respond.

## 2019-03-01 NOTE — Progress Notes (Signed)
MD--Patient reports he takes Suboxone 8mg  BID PO.  This med is not on [facility]
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
## 2019-03-01 NOTE — Progress Notes (Signed)
Called and spoke to [facility], notified her of order BMET Q 4 hours.  She will see pt soon.

## 2019-03-01 NOTE — Progress Notes (Addendum)
Last 3 consecutive hourly CBG readings have been > 200.  Pt anion gap communicated to MD Blount via Amion approx 2:44am, no response back.  Pt kept NPO.   After 2 hours of holding Insulin gtt per Glucose Stabilizer, Messaged MD Blount at 04:54, asking to place DKA Transition Orders.    Order to stop IV insulin gtt 2 hours after basal insulin given, no basal insulin ordered.  Messaged MD, she ordered basal Lantus to be given at 10:00.  Carb Mod diet ordered, pt refuse to eat currently.  Lantus to be given at 10am.

## 2019-03-01 NOTE — Progress Notes (Signed)
PROGRESS NOTE    Donald HenleDaniel Hartman  ZOX:[id number] DOB: 04-08-1986 DOA: 02/28/2019 PCP: Patient, No Pcp Per     Brief Narrative:  Donald Hartman is a 33 y.o. male with medical history significant of type 1 diabetes who was recently admitted with diabetic ketoacidosis.    He is currently homeless, has had multiple admissions in different hospitals for DKA.  Patient has had issues with suicidal ideation, abnormal behavior and aggressiveness.  During his previous hospitalization, he was evaluated by psychiatry. Since he was discharged from the hospital he did not get his insulin.  He states that he did not know where to go get his medications from.  Also admits to injecting heroin.  Patient was admitted for DKA.   New events last 24 hours / Subjective: Feeling better, although oral intake is not great.  Complaining of acid reflux.  Assessment & Plan:   Principal Problem:   DKA (diabetic ketoacidoses) (HCC) Active Problems:   MDD (major depressive disorder), recurrent episode, severe (H[facility])   Opioid dependence with opioid-induced mood disorder (HCC)   DKA secondary to medical noncompliance -DKA now resolved -Continue NovoLog 70/30, sliding scale -Appreciate diabetic coordinator -Case manager assisting with medication  Substance abuse -Patient was previously on Suboxone.  UDS positive for THC  Mood disorder -Continue medications per psych previous admission, resume Abilify, trazodone, Wellbutrin, Zoloft  GERD -Protonix   DVT prophylaxis: Lovenox Code Status: Full code Family Communication: None Disposition Plan: Pending further improvement, case manager/social work assisting with medications for discharge.  Plan to discharge 7/21 if remains medically stable   Consultants:   None  Procedures:   None  Antimicrobials:  Anti-infectives (From admission, onward)   None        Objective: Vitals:   02/28/19 2145 02/28/19 2200 02/28/19 2342 03/01/19 0842  BP: 125/75  125/81 123/80   Pulse:      Resp: (!) 22 20    Temp:   97.7 F (36.5 C) 98.7 F (37.1 C)  TempSrc:   Oral Oral  SpO2: 97%  98%   Weight:      Height:        Intake/Output Summary (Last 24 hours) at 03/01/2019 1208 Last data filed at 03/01/2019 0900 Gross per 24 hour  Intake 3160 ml  Output 500 ml  Net 2660 ml   Filed Weights   02/28/19 1823  Weight: 72.6 kg    Examination:  General exam: Appears calm and comfortable  Respiratory system: Clear to auscultation. Respiratory effort normal. Cardiovascular system: S1 & S2 heard, RRR. No JVD, murmurs, rubs, gallops or clicks. No pedal edema. Gastrointestinal system: Abdomen is nondistended, soft and nontender. No organomegaly or masses felt. Normal bowel sounds heard. Central nervous system: Alert and oriented. No focal neurological deficits. Extremities: Symmetric 5 x 5 power. Skin: No rashes, lesions or ulcers Psychiatry: Judgement and insight appear normal. Mood & affect appropriate.   Data Reviewed: I have personally reviewed following labs and imaging studies  CBC: Recent Labs  Lab 02/24/19 0935 02/25/19 0505 02/26/19 0552 02/28/19 1720 03/01/19 0150  WBC 5.9 5.2 4.8 4.6 4.7  NEUTROABS 4.0 2.3 2.0  --   --   HGB 13.8 12.5* 11.9* 12.0* 11.3*  HCT 43.0 39.0 37.7* 36.7* 34.1*  MCV 91.7 90.7 94.0 92.2 88.8  PLT 228 PLATELET CLUMPS NOTED ON SMEAR, UNABLE TO ESTIMATE 232 258 228   Basic Metabolic Panel: Recent Labs  Lab 02/24/19 0935 02/25/19 0505 02/26/19 [id number] 02/28/19 1720 03/01/19 0150 03/01/19 [id number]  NA 137 134* 137 131* 140 138  K 3.9 4.2 3.8 4.7 3.7 4.1  CL 98 97* 102 91* 104 102  CO2 28 27 27  20* 28 25  GLUCOSE 174* 272* 235* 796* 100* 277*  BUN 7 10 11 14 10 12   CREATININE 0.80 0.92 0.93 1.05 0.85 0.85  CALCIUM 9.2 8.7* 8.5* 9.0 8.4* 8.2*  MG 1.8 1.8 2.0  --   --   --   PHOS 4.4 4.0 4.8*  --   --   --    GFR: Estimated Creatinine Clearance: 123.6 mL/min (by C-G formula based on SCr of 0.85 mg/dL).  Liver Function Tests: Recent Labs  Lab 02/24/19 0935 02/25/19 0505 02/26/19 0552  AST 139* 101* 81*  ALT 144* 120* 106*  ALKPHOS 59 55 48  BILITOT 0.5 0.1* 0.3  PROT 7.0 5.9* 5.8*  ALBUMIN 3.7 3.2* 3.1*   No results for input(s): LIPASE, AMYLASE in the last 168 hours. No results for input(s): AMMONIA in the last 168 hours. Coagulation Profile: No results for input(s): INR, PROTIME in the last 168 hours. Cardiac Enzymes: No results for input(s): CKTOTAL, CKMB, CKMBINDEX, TROPONINI in the last 168 hours. BNP (last 3 results) No results for input(s): PROBNP in the last 8760 hours. HbA1C: No results for input(s): HGBA1C in the last 72 hours. CBG: Recent Labs  Lab 03/01/19 0435 03/01/19 0548 03/01/19 0654 03/01/19 0752 03/01/19 0859  GLUCAP 153* 216* 265* 287* 315*   Lipid Profile: No results for input(s): CHOL, HDL, LDLCALC, TRIG, CHOLHDL, LDLDIRECT in the last 72 hours. Thyroid Function Tests: No results for input(s): TSH, T4TOTAL, FREET4, T3FREE, THYROIDAB in the last 72 hours. Anemia Panel: No results for input(s): VITAMINB12, FOLATE, FERRITIN, TIBC, IRON, RETICCTPCT in the last 72 hours. Sepsis Labs: No results for input(s): PROCALCITON, LATICACIDVEN in the last 168 hours.  Recent Results (from the past 240 hour(s))  SARS Coronavirus 2 (CEPHEID - [facility]), Hosp Order     Status: None   Collection Time: 02/28/19  8:25 PM   Specimen: Nasopharyngeal Swab  Result Value Ref Range Status   SARS Coronavirus 2 NEGATIVE NEGATIVE Final    Comment: (NOTE) If result is NEGATIVE SARS-CoV-2 target nucleic acids are NOT DETECTED. The SARS-CoV-2 RNA is generally detectable in upper and lower  respiratory specimens during the acute phase of infection. The lowest  concentration of SARS-CoV-2 viral copies this assay can detect is 250  copies / mL. A negative result does not preclude SARS-CoV-2 infection  and should not be used as the sole basis for  treatment or other  patient management decisions.  A negative result may occur with  improper specimen collection / handling, submission of specimen other  than nasopharyngeal swab, presence of viral mutation(s) within the  areas targeted by this assay, and inadequate number of viral copies  (<250 copies / mL). A negative result must be combined with clinical  observations, patient history, and epidemiological information. If result is POSITIVE SARS-CoV-2 target nucleic acids are DETECTED. The SARS-CoV-2 RNA is generally detectable in upper and lower  respiratory specimens dur ing the acute phase of infection.  Positive  results are indicative of active infection with SARS-CoV-2.  Clinical  correlation with patient history and other diagnostic information is  necessary to determine patient infection status.  Positive results do  not rule out bacterial infection or co-infection with other viruses. If result is PRESUMPTIVE POSTIVE SARS-CoV-2 nucleic acids MAY BE PRESENT.   A presumptive  positive result was obtained on the submitted specimen  and confirmed on repeat testing.  While 2019 novel coronavirus  (SARS-CoV-2) nucleic acids may be present in the submitted sample  additional confirmatory testing may be necessary for epidemiological  and / or clinical management purposes  to differentiate between  SARS-CoV-2 and other Sarbecovirus currently known to infect humans.  If clinically indicated additional testing with an alternate test  methodology [id number]) is advised. The SARS-CoV-2 RNA is generally  detectable in upper and lower respiratory sp ecimens during the acute  phase of infection. The expected result is Negative. Fact Sheet for Patients:  StrictlyIdeas.no Fact Sheet for Healthcare Providers: BankingDealers.co.za This test is not yet approved or cleared by the [place] FDA and has been authorized for detection and/or  diagnosis of SARS-CoV-2 by FDA under an Emergency Use Authorization (EUA).  This EUA will remain in effect (meaning this test can be used) for the duration of the COVID-19 declaration under Section 564(b)(1) of the Act, 21 U.S.C. section 360bbb-3(b)(1), unless the authorization is terminated or revoked sooner. Performed at [facility], [place] [address] [place], [place] [address]       Radiology Studies: No results found.    Scheduled Meds: . ARIPiprazole  5 mg Oral Daily  . buprenorphine-naloxone  1 tablet Sublingual Daily  . buPROPion  300 mg Oral Daily  . enoxaparin (LOVENOX) injection  40 mg Subcutaneous Q24H  . insulin aspart  0-15 Units Subcutaneous Q4H  . insulin aspart protamine- aspart  35 Units Subcutaneous BID WC  . pantoprazole  40 mg Oral BID AC  . sertraline  100 mg Oral Daily   Continuous Infusions:   LOS: 1 day     Time spent: 35 minutes   Dessa Phi, DO [facility] [email or web address] 03/01/2019, 12:08 PM

## 2019-03-02 LAB — BASIC METABOLIC PANEL
Anion gap: 10 (ref 5–15)
BUN: <5 mg/dL — ABNORMAL LOW (ref 6–20)
CO2: 26 mmol/L (ref 22–32)
Calcium: 8.6 mg/dL — ABNORMAL LOW (ref 8.9–10.3)
Chloride: 100 mmol/L (ref 98–111)
Creatinine, Ser: 0.94 mg/dL (ref 0.61–1.24)
GFR calc Af Amer: >60 mL/min (ref >60)
GFR calc non Af Amer: >60 mL/min (ref >60)
Glucose, Bld: 231 mg/dL — ABNORMAL HIGH (ref 70–99)
Potassium: 3 mmol/L — ABNORMAL LOW (ref 3.5–5.1)
Sodium: 136 mmol/L (ref 135–145)

## 2019-03-02 LAB — GLUCOSE, CAPILLARY
Glucose-Capillary: 102 mg/dL — ABNORMAL HIGH (ref 70–99)
Glucose-Capillary: 133 mg/dL — ABNORMAL HIGH (ref 70–99)
Glucose-Capillary: 153 mg/dL — ABNORMAL HIGH (ref 70–99)
Glucose-Capillary: 223 mg/dL — ABNORMAL HIGH (ref 70–99)
Glucose-Capillary: 232 mg/dL — ABNORMAL HIGH (ref 70–99)
Glucose-Capillary: 242 mg/dL — ABNORMAL HIGH (ref 70–99)
Glucose-Capillary: 60 mg/dL — ABNORMAL LOW (ref 70–99)

## 2019-03-02 LAB — HCV RNA (INTERNATIONAL UNITS)
HCV RNA (International Units): 12500000 IU/mL
HCV log10: 7.097 log10 IU/mL

## 2019-03-02 LAB — HCV RNA QUANT

## 2019-03-02 MED ORDER — ACETAMINOPHEN 325 MG PO TABS
650.0000 mg | ORAL_TABLET | Freq: Four times a day (QID) | ORAL | Status: DC | PRN
  Administered 2019-03-02: 650 mg via ORAL
  Filled 2019-03-02: qty 2

## 2019-03-02 MED ORDER — POTASSIUM CHLORIDE CRYS ER 20 MEQ PO TBCR
40.0000 meq | EXTENDED_RELEASE_TABLET | ORAL | Status: AC
  Administered 2019-03-02 (×2): 40 meq via ORAL
  Filled 2019-03-02 (×2): qty 2

## 2019-03-02 MED ORDER — HALOPERIDOL LACTATE 5 MG/ML IJ SOLN: 2.5000 mg | Freq: Four times a day (QID) | INTRAMUSCULAR | Status: DC | PRN

## 2019-03-02 NOTE — Progress Notes (Signed)
I was called into the room by NT [place].  When I arrived to the room, the patient was noted to be sitting on the side of the bed with multiple lacerations to his right arm.  Pt was not severely bleeding.  4x4's and kerlix used to bandage wounds.  Patient stated that he had used a lancet used for CBG's to make the lacerations.  Patient states "I'm hearing voices."  A search of the patient's belongings was conducted and a pocket knife was found and confiscated.  The patient also stated that he had a razor blade on top of the soap dispenser at the sink, which was found and confiscated.  Paged [facility] provider Tylene Fantasia, who ordered suicide precautions with 1:1 sitter.  When I asked the patient where he got the lancet, he stated "I don't know."  When asked if the lancet was in the room, the patient said "yes."

## 2019-03-02 NOTE — Progress Notes (Addendum)
PROGRESS NOTE    Donald HenleDaniel Hartman  BMW:[id number] DOB: May 21, 1986 DOA: 02/28/2019 PCP: Patient, No Pcp Per     Brief Narrative:  Donald Hartman is a 33 y.o. male with medical history significant of type 1 diabetes who was recently admitted with diabetic ketoacidosis.    He is currently homeless, has had multiple admissions in different hospitals for DKA.  Patient has had issues with suicidal ideation, abnormal behavior and aggressiveness.  During his previous hospitalization, he was evaluated by psychiatry. Since he was discharged from the hospital he did not get his insulin.  He states that he did not know where to go get his medications from.  Also admits to injecting heroin.  Patient was admitted for DKA.   New events last 24 hours / Subjective: States that he is still feeling poorly, having significant nausea.  Had vomiting yesterday.  Having a headache, asking for Tylenol  Assessment & Plan:   Principal Problem:   DKA (diabetic ketoacidoses) (HCC) Active Problems:   MDD (major depressive disorder), recurrent episode, severe (HCC)   Opioid dependence with opioid-induced mood disorder (HCC)   DKA secondary to medical noncompliance -DKA now resolved -Continue NovoLog 70/30, sliding scale -Appreciate diabetic coordinator -Case manager assisting with medication, patient [facility]  Substance abuse -Patient was previously on Suboxone.  [facility] -[facility] initiation  Mood disorder -Continue medications per psych previous admission, resume Abilify, trazodone, Wellbutrin, Zoloft  GERD -Protonix  Hypokalemia -Replace, trend   DVT prophylaxis: Lovenox Code Status: Full code Family Communication: None Disposition Plan: Pending further improvement in nausea, vomiting.  Hopeful discharge 7/22   Consultants:   None  Procedures:   None   Antimicrobials:  Anti-infectives (From admission, onward)   None       Objective: Vitals:   03/01/19 1552 03/01/19 2155 03/02/19 0558 03/02/19 1111  BP: 139/88 122/83 104/77 (!) 148/91  Pulse: 81 74 77 87  Resp: 17 18 16 18   Temp: 98.2 F (36.8 C) 98.4 F (36.9 C) 98.3 F (36.8 C) 98.7 F (37.1 C)  TempSrc: Oral Oral Oral   SpO2: 98% 99% 100% 99%  Weight:      Height:        Intake/Output Summary (Last 24 hours) at 03/02/2019 1338 Last data filed at 03/01/2019 1836 Gross per 24 hour  Intake 85.13 ml  Output -  Net 85.13 ml   Filed Weights   02/28/19 1823  Weight: 72.6 kg    Examination: General exam: Appears calm and comfortable  Respiratory system: Clear to auscultation. Respiratory effort normal. Cardiovascular system: S1 & S2 heard, RRR. No JVD, murmurs, rubs, gallops or clicks. No pedal edema. Gastrointestinal system: Abdomen is nondistended, soft and nontender. No organomegaly or masses felt. Normal bowel sounds heard. Central nervous system: Alert and oriented. No focal neurological deficits. Extremities: Symmetric 5 x 5 power. Skin: No rashes, lesions or ulcers Psychiatry: Judgement and insight appear normal. Mood & affect appropriate.   Data Reviewed: I have personally reviewed following labs and imaging studies  CBC: Recent Labs  Lab 02/24/19 0935 02/25/19 0505 02/26/19 0552 02/28/19 1720 03/01/19 0150  WBC 5.9 5.2 4.8 4.6 4.7  NEUTROABS 4.0 2.3 2.0  --   --   HGB 13.8 12.5* 11.9* 12.0* 11.3*  HCT 43.0 39.0 37.7* 36.7* 34.1*  MCV 91.7 90.7 94.0 92.2 88.8  PLT 228 PLATELET CLUMPS NOTED ON  SMEAR, UNABLE TO ESTIMATE 232 258 228   Basic Metabolic Panel: Recent Labs  Lab 02/24/19 0935 02/25/19 0505 02/26/19 0552 02/28/19 1720 03/01/19 0150 03/01/19 0623 03/02/19 0330  NA 137 134* 137 131* 140 138 136  K 3.9 4.2 3.8 4.7 3.7 4.1 3.0*  CL 98 97* 102 91* 104 102 100  CO2 28 27 27  20* 28 25 26   GLUCOSE 174* 272* 235* 796* 100* 277* 231*  BUN  7 10 11 14 10 12  <5*  CREATININE 0.80 0.92 0.93 1.05 0.85 0.85 0.94  CALCIUM 9.2 8.7* 8.5* 9.0 8.4* 8.2* 8.6*  MG 1.8 1.8 2.0  --   --   --   --   PHOS 4.4 4.0 4.8*  --   --   --   --    GFR: Estimated Creatinine Clearance: 111.8 mL/min (by C-G formula based on SCr of 0.94 mg/dL). Liver Function Tests: Recent Labs  Lab 02/24/19 0935 02/25/19 0505 02/26/19 0552  AST 139* 101* 81*  ALT 144* 120* 106*  ALKPHOS 59 55 48  BILITOT 0.5 0.1* 0.3  PROT 7.0 5.9* 5.8*  ALBUMIN 3.7 3.2* 3.1*   No results for input(s): LIPASE, AMYLASE in the last 168 hours. No results for input(s): AMMONIA in the last 168 hours. Coagulation Profile: No results for input(s): INR, PROTIME in the last 168 hours. Cardiac Enzymes: No results for input(s): CKTOTAL, CKMB, CKMBINDEX, TROPONINI in the last 168 hours. BNP (last 3 results) No results for input(s): PROBNP in the last 8760 hours. HbA1C: No results for input(s): HGBA1C in the last 72 hours. CBG: Recent Labs  Lab 03/01/19 2008 03/02/19 0017 03/02/19 0423 03/02/19 0725 03/02/19 1110  GLUCAP 82 102* 242* 232* 223*   Lipid Profile: No results for input(s): CHOL, HDL, LDLCALC, TRIG, CHOLHDL, LDLDIRECT in the last 72 hours. Thyroid Function Tests: No results for input(s): TSH, T4TOTAL, FREET4, T3FREE, THYROIDAB in the last 72 hours. Anemia Panel: No results for input(s): VITAMINB12, FOLATE, FERRITIN, TIBC, IRON, RETICCTPCT in the last 72 hours. Sepsis Labs: No results for input(s): PROCALCITON, LATICACIDVEN in the last 168 hours.  Recent Results (from the past 240 hour(s))  SARS Coronavirus 2 (CEPHEID - Performed in [facility] lab), Hosp Order     Status: None   Collection Time: 02/28/19  8:25 PM   Specimen: Nasopharyngeal Swab  Result Value Ref Range Status   SARS Coronavirus 2 NEGATIVE NEGATIVE Final    Comment: (NOTE) If result is NEGATIVE SARS-CoV-2 target nucleic acids are NOT DETECTED. The SARS-CoV-2 RNA is generally  detectable in upper and lower  respiratory specimens during the acute phase of infection. The lowest  concentration of SARS-CoV-2 viral copies this assay can detect is 250  copies / mL. A negative result does not preclude SARS-CoV-2 infection  and should not be used as the sole basis for treatment or other  patient management decisions.  A negative result may occur with  improper specimen collection / handling, submission of specimen other  than nasopharyngeal swab, presence of viral mutation(s) within the  areas targeted by this assay, and inadequate number of viral copies  (<250 copies / mL). A negative result must be combined with clinical  observations, patient history, and epidemiological information. If result is POSITIVE SARS-CoV-2 target nucleic acids are DETECTED. The SARS-CoV-2 RNA is generally detectable in upper and lower  respiratory specimens dur ing the acute phase of infection.  Positive  results are indicative of active infection with SARS-CoV-2.  Clinical  correlation  with patient history and other diagnostic information is  necessary to determine patient infection status.  Positive results do  not rule out bacterial infection or co-infection with other viruses. If result is PRESUMPTIVE POSTIVE SARS-CoV-2 nucleic acids MAY BE PRESENT.   A presumptive positive result was obtained on the submitted specimen  and confirmed on repeat testing.  While 2019 novel coronavirus  (SARS-CoV-2) nucleic acids may be present in the submitted sample  additional confirmatory testing may be necessary for epidemiological  and / or clinical management purposes  to differentiate between  SARS-CoV-2 and other Sarbecovirus currently known to infect humans.  If clinically indicated additional testing with an alternate test  methodology [phone]) is advised. The SARS-CoV-2 RNA is generally  detectable in upper and lower respiratory sp ecimens during the acute  phase of infection. The  expected result is Negative. Fact Sheet for Patients:  StrictlyIdeas.no Fact Sheet for Healthcare Providers: BankingDealers.co.za This test is not yet approved or cleared by the [place] FDA and has been authorized for detection and/or diagnosis of SARS-CoV-2 by FDA under an Emergency Use Authorization (EUA).  This EUA will remain in effect (meaning this test can be used) for the duration of the COVID-19 declaration under Section 564(b)(1) of the Act, 21 U.S.C. section 360bbb-3(b)(1), unless the authorization is terminated or revoked sooner. Performed at [facility], [place] [address] [place], [place] [address]       Radiology Studies: No results found.    Scheduled Meds: . ARIPiprazole  5 mg Oral Daily  . buprenorphine-naloxone  1 tablet Sublingual BID  . buPROPion  300 mg Oral Daily  . enoxaparin (LOVENOX) injection  40 mg Subcutaneous Q24H  . insulin aspart  0-15 Units Subcutaneous TID WC  . insulin aspart protamine- aspart  35 Units Subcutaneous BID WC  . pantoprazole  40 mg Oral BID AC  . sertraline  100 mg Oral Daily   Continuous Infusions:   LOS: 2 days     Time spent: 30 minutes   Dessa Phi, DO [facility] [email or web address] 03/02/2019, 1:38 PM

## 2019-03-02 NOTE — Progress Notes (Signed)
Pt AOX4 and is able to speak with family. Told pt to let them know to call with any questions.
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
## 2019-03-03 DIAGNOSIS — E876 Hypokalemia: Secondary | ICD-10-CM

## 2019-03-03 DIAGNOSIS — F4325 Adjustment disorder with mixed disturbance of emotions and conduct: Secondary | ICD-10-CM

## 2019-03-03 LAB — GLUCOSE, CAPILLARY
Glucose-Capillary: 108 mg/dL — ABNORMAL HIGH (ref 70–99)
Glucose-Capillary: 141 mg/dL — ABNORMAL HIGH (ref 70–99)
Glucose-Capillary: 222 mg/dL — ABNORMAL HIGH (ref 70–99)
Glucose-Capillary: 136 mg/dL — ABNORMAL HIGH (ref 70–99)
Glucose-Capillary: 62 mg/dL — ABNORMAL LOW (ref 70–99)

## 2019-03-03 LAB — BASIC METABOLIC PANEL
Anion gap: 11 (ref 5–15)
BUN: <5 mg/dL — ABNORMAL LOW (ref 6–20)
CO2: 25 mmol/L (ref 22–32)
Calcium: 9.1 mg/dL (ref 8.9–10.3)
Chloride: 101 mmol/L (ref 98–111)
Creatinine, Ser: 0.94 mg/dL (ref 0.61–1.24)
GFR calc Af Amer: >60 mL/min (ref >60)
GFR calc non Af Amer: >60 mL/min (ref >60)
Glucose, Bld: 109 mg/dL — ABNORMAL HIGH (ref 70–99)
Potassium: 3.4 mmol/L — ABNORMAL LOW (ref 3.5–5.1)
Sodium: 137 mmol/L (ref 135–145)

## 2019-03-03 LAB — MAGNESIUM: Magnesium: 1.6 mg/dL — ABNORMAL LOW (ref 1.7–2.4)

## 2019-03-03 MED ORDER — POTASSIUM CHLORIDE CRYS ER 20 MEQ PO TBCR
40.0000 meq | EXTENDED_RELEASE_TABLET | Freq: Once | ORAL | Status: AC
  Administered 2019-03-03: 40 meq via ORAL
  Filled 2019-03-03: qty 2

## 2019-03-03 NOTE — Consult Note (Addendum)
Telepsych Consultation   Reason for Consult:  "hearing voices, lacerated arm with CBG needle" Referring Physician:  Dr. Edsel PetrinMaryann Mikhail Location of Patient: [facility] Location of Provider: [facility]  Patient Identification: Donald HenleDaniel Hartman MRN:  [id number] Principal Diagnosis: Adjustment disorder with mixed disturbance of emotions and conduct Diagnosis:  Principal Problem:   DKA (diabetic ketoacidoses) (HCC) Active Problems:   MDD (major depressive disorder), recurrent episode, severe (HCC)   Opioid dependence with opioid-induced mood disorder (HCC)   Total Time spent with patient: 1 hour  Subjective:   Donald Hartman is a 33 y.o. male patient admitted with DKA.  HPI:   Per chart review, patient was admitted with DKA. He reports that he did not have his diabetic medications filled from prior hospitalization. He reports using heroin. He was previously started on Suboxone during prior admission but it was not continued at discharge. It has been restarted since hospitalization. Home medications include Abilify 5 mg daily, Wellbutrin 300 mg daily, Trazodone 100 mg qhs PRN and Zoloft 100 mg daily. Overnight, he made multiple lacerations to his right arm with a lancet after an argument with his girlfriend. He endorsed AH. He then reported that he had "something else hidden" and his room was searched. The sharps box was removed from his room.   Of note, patient was last seen by the psychiatry consult service on 7/17. He was psychiatrically cleared. He was strongly believed to be malingering for shelter due to SI in the setting of homelessness. He denied SI after housing was secured with a plan to temporarily live with a friend and start a job in roofing.   On interview, Donald Hartman reports that he is under "too much stress and depression." He has not been properly caring for himself. He reports that he cut his arm because he had an argument with his girlfriend and they did not speak  for a day. He reports that she made a statement "we are just friends." He reports that they have been dating for 5 months. He reports that he would like to go back to [place] to be with his family. He reports that he will be safe with his family. He reports that he is homeless and cannot stay with the friend he went to live with at prior discharge because he was using heroin. He denies SI, HI or AVH. He reports poor sleep. He denies problems with appetite.   Past Psychiatric History: Polysubstance abuse (cocaine, benzodiazepine, heroin and alcohol), MDD and bipolar disorder. History of cutting behaviors.   Risk to Self:  Low. Denies SI.  Risk to Others:  None. Denies HI.  Prior Inpatient Therapy:   History of multiple hospitalizations. [facility]6/[facility] for SI. [facility]  Past Medical History:  Past Medical History:  Diagnosis Date  . Diabetes mellitus without complication (HCC)    No past surgical history on file. Family History: No family history on file. Family Psychiatric  History: Denies  Social History:  Social History   Substance and Sexual Activity  Alcohol Use None     Social History   Substance and Sexual Activity  Drug Use Not on file    Social History   Socioeconomic History  . Marital status: Single    Spouse name: Not on file  . Number of children: Not on file  . Years of education: Not on file  . Highest education level: Not on  file  Occupational History  . Not on file  Social Needs  . Financial resource strain: Very hard  . Food insecurity    Worry: Often true    Inability: Often true  . Transportation needs    Medical: Yes    Non-medical: Yes  Tobacco Use  . Smoking status: Current Every Day Smoker  . Smokeless tobacco: Never Used  Substance and Sexual Activity  . Alcohol use: Not on file  . Drug use: Not on file  . Sexual activity: Yes    Birth  control/protection: None  Lifestyle  . Physical activity    Days per week: 0 days    Minutes per session: 0 min  . Stress: Very much  Relationships  . Social Musicianconnections    Talks on phone: Never    Gets together: Never    Attends religious service: Never    Active member of club or organization: No    Attends meetings of clubs or organizations: Not on file    Relationship status: Never married  Other Topics Concern  . Not on file  Social History Narrative  . Not on file   Additional Social History: He is homeless.     Allergies:  No Known Allergies  Labs:  Results for orders placed or performed during the hospital encounter of 02/28/19 (from the past 48 hour(s))  Glucose, capillary     Status: Abnormal   Collection Time: 03/01/19  4:32 PM  Result Value Ref Range   Glucose-Capillary 323 (H) 70 - 99 mg/dL  Glucose, capillary     Status: None   Collection Time: 03/01/19  8:08 PM  Result Value Ref Range   Glucose-Capillary 82 70 - 99 mg/dL  Glucose, capillary     Status: Abnormal   Collection Time: 03/02/19 12:17 AM  Result Value Ref Range   Glucose-Capillary 102 (H) 70 - 99 mg/dL  Basic metabolic panel     Status: Abnormal   Collection Time: 03/02/19  3:30 AM  Result Value Ref Range   Sodium 136 135 - 145 mmol/L   Potassium 3.0 (L) 3.5 - 5.1 mmol/L   Chloride 100 98 - 111 mmol/L   CO2 26 22 - 32 mmol/L   Glucose, Bld 231 (H) 70 - 99 mg/dL   BUN <5 (L) 6 - 20 mg/dL   Creatinine, Ser 1.610.94 0.61 - 1.24 mg/dL   Calcium 8.6 (L) 8.9 - 10.3 mg/dL   GFR calc non Af Amer >60 >60 mL/min   GFR calc Af Amer >60 >60 mL/min   Anion gap 10 5 - 15    Comment: Performed at [facility], [address] [place], [place] [address]  Glucose, capillary     Status: Abnormal   Collection Time: 03/02/19  4:23 AM  Result Value Ref Range   Glucose-Capillary 242 (H) 70 - 99 mg/dL  Glucose, capillary     Status: Abnormal   Collection Time: 03/02/19  7:25 AM  Result Value Ref Range    Glucose-Capillary 232 (H) 70 - 99 mg/dL  Glucose, capillary     Status: Abnormal   Collection Time: 03/02/19 11:10 AM  Result Value Ref Range   Glucose-Capillary 223 (H) 70 - 99 mg/dL  Glucose, capillary     Status: Abnormal   Collection Time: 03/02/19  4:34 PM  Result Value Ref Range   Glucose-Capillary 153 (H) 70 - 99 mg/dL  Glucose, capillary     Status: Abnormal   Collection Time: 03/02/19  9:15 PM  Result Value Ref Range   Glucose-Capillary 60 (L) 70 - 99 mg/dL   Comment 1 Notify RN    Comment 2 Document in Chart   Glucose, capillary     Status: Abnormal   Collection Time: 03/02/19 10:22 PM  Result Value Ref Range   Glucose-Capillary 133 (H) 70 - 99 mg/dL   Comment 1 Notify RN    Comment 2 Document in Chart   Basic metabolic panel     Status: Abnormal   Collection Time: 03/03/19  7:15 AM  Result Value Ref Range   Sodium 137 135 - 145 mmol/L   Potassium 3.4 (L) 3.5 - 5.1 mmol/L   Chloride 101 98 - 111 mmol/L   CO2 25 22 - 32 mmol/L   Glucose, Bld 109 (H) 70 - 99 mg/dL   BUN <5 (L) 6 - 20 mg/dL   Creatinine, Ser 1.91 0.61 - 1.24 mg/dL   Calcium 9.1 8.9 - 47.8 mg/dL   GFR calc non Af Amer >60 >60 mL/min   GFR calc Af Amer >60 >60 mL/min   Anion gap 11 5 - 15    Comment: Performed at [facility], [address] [place], [place] [address]  Magnesium     Status: Abnormal   Collection Time: 03/03/19  7:15 AM  Result Value Ref Range   Magnesium 1.6 (L) 1.7 - 2.4 mg/dL    Comment: Performed at [facility], [address] [place], [place] [address]  Glucose, capillary     Status: Abnormal   Collection Time: 03/03/19  7:55 AM  Result Value Ref Range   Glucose-Capillary 108 (H) 70 - 99 mg/dL  Glucose, capillary     Status: Abnormal   Collection Time: 03/03/19 11:44 AM  Result Value Ref Range   Glucose-Capillary 141 (H) 70 - 99 mg/dL    Medications:  Current Facility-Administered Medications  Medication Dose Route Frequency Provider Last Rate Last Dose  .  acetaminophen (TYLENOL) tablet 650 mg  650 mg Oral Q6H PRN Noralee Stain, DO   650 mg at 03/02/19 1302  . ARIPiprazole (ABILIFY) tablet 5 mg  5 mg Oral Daily Noralee Stain, DO   5 mg at 03/03/19 5784  . buprenorphine-naloxone (SUBOXONE) 8-2 mg per SL tablet 1 tablet  1 tablet Sublingual BID Noralee Stain, DO   1 tablet at 03/03/19 6962  . buPROPion (WELLBUTRIN XL) 24 hr tablet 300 mg  300 mg Oral Daily Noralee Stain, DO   300 mg at 03/03/19 9528  . calcium carbonate (TUMS - dosed in mg elemental calcium) chewable tablet 200 mg of elemental calcium  1 tablet Oral Daily PRN Noralee Stain, DO      . dextrose 50 % solution 25 mL  25 mL Intravenous PRN Earlie Lou L, MD      . enoxaparin (LOVENOX) injection 40 mg  40 mg Subcutaneous Q24H Garba, Mohammad L, MD      . haloperidol lactate (HALDOL) injection 2.5 mg  2.5 mg Intravenous Q6H PRN Kirby-Graham, Beather Arbour, NP      . insulin aspart (novoLOG) injection 0-15 Units  0-15 Units Subcutaneous TID WC Noralee Stain, DO   2 Units at 03/03/19 1215  . insulin aspart protamine- aspart (NOVOLOG MIX 70/30) injection 35 Units  35 Units Subcutaneous BID WC Noralee Stain, DO   35 Units at 03/03/19 4132  . ondansetron (ZOFRAN) injection 4 mg  4 mg Intravenous Q6H PRN Kirby-Graham, Beather Arbour, NP  4 mg at 03/01/19 2338  . pantoprazole (PROTONIX) EC tablet 40 mg  40 mg Oral BID AC Noralee Stainhoi, Jennifer, DO   40 mg at 03/03/19 [id number]  . sertraline (ZOLOFT) tablet 100 mg  100 mg Oral Daily Noralee Stainhoi, Jennifer, DO   100 mg at 03/03/19 [id number]  . traZODone (DESYREL) tablet 100 mg  100 mg Oral QHS PRN Noralee Stainhoi, Jennifer, DO   100 mg at 03/02/19 2037    Musculoskeletal: Strength & Muscle Tone: No atrophy noted. Gait & Station: UTA since patient is lying in bed. Patient leans: N/A  Psychiatric Specialty Exam: Physical Exam  Nursing note and vitals reviewed. Constitutional: He is oriented to person, place, and time. He appears well-developed and well-nourished.  HENT:  Head:  Normocephalic and atraumatic.  Neck: Normal range of motion.  Respiratory: Effort normal.  Musculoskeletal: Normal range of motion.  Neurological: He is alert and oriented to person, place, and time.  Skin:     Psychiatric: His speech is normal and behavior is normal. Thought content normal. Cognition and memory are normal. He expresses impulsivity. He exhibits a depressed mood.    Review of Systems  Psychiatric/Behavioral: Positive for depression and substance abuse. Negative for hallucinations and suicidal ideas. The patient has insomnia.   All other systems reviewed and are negative.   Blood pressure 117/76, pulse 78, temperature 98.2 F (36.8 C), temperature source Oral, resp. rate 18, height 5\' 9"  (1.753 m), weight 72.6 kg, SpO2 98 %.Body mass index is 23.64 kg/m.  General Appearance: Fairly Groomed, young, Hispanic male, wearing paper hospital scrub bottoms with a bare chest, multiple tattoos and a "GOD" tattoo on his face who is sitting in bed. NAD.   Eye Contact:  Good  Speech:  Clear and Coherent and Normal Rate  Volume:  Normal  Mood:  Depressed  Affect:  Constricted  Thought Process:  Goal Directed, Linear and Descriptions of Associations: Intact  Orientation:  Full (Time, Place, and Person)  Thought Content:  Logical  Suicidal Thoughts:  No  Homicidal Thoughts:  No  Memory:  Immediate;   Good Recent;   Good Remote;   Good  Judgement:  Poor  Insight:  Fair  Psychomotor Activity:  Normal  Concentration:  Concentration: Good and Attention Span: Good  Recall:  Good  Fund of Knowledge:  Good  Language:  Good  Akathisia:  No  Handed:  Right  AIMS (if indicated):   N/A  Assets:  Communication Skills Desire for Improvement Resilience Social Support  ADL's:  Intact  Cognition:  WNL  Sleep:   Poor   Assessment:  Donald Hartman is a 33 y.o. male who was admitted with DKA. Psychiatry was consulted after patient made multiple lacerations to his right arm with a  lancet after an argument with his girlfriend. He endorsed AH. He endorses depressed mood in the setting of multiple psychosocial stressors. He denies SI, HI or AVH. He does not appear to be responding to internal stimuli. He has a history of self-injurious behaviors due to poor stress tolerance. His presentation is likely attributed to a personality component. Recommend increasing Wellbutrin for depression. Please have SW provide patent with outpatient mental health services for medication management and substance abuse treatment.   Treatment Plan Summary: -Increase Wellbutrin 300 mg daily to 450 mg daily for depression.  -Continue Abilify 5 mg daily for mood stabilization/psychosis, Trazodone 100 mg qhs PRN for insomnia and Zoloft 100 mg daily for mood.  -Please have SW provide patient with outpatient  mental health resources for medication management and substance abuse treatment.  -Psychiatry will sign off on patient at this time. Please consult psychiatry again as needed.   Disposition: Patient does not meet criteria for psychiatric inpatient admission.  This service was provided via telemedicine using a 2-way, interactive audio and video technology.  Names of all persons participating in this telemedicine service and their role in this encounter. Name: Donald Dresser, DO Role: Psychiatrist   Name: Donald Hartman  Role: Patient     Faythe Dingwall, DO 03/03/2019 2:18 PM

## 2019-03-03 NOTE — Progress Notes (Signed)
Nurse called into the room where the patient was found with several self inflicted lacerations to the right posterior forearm.  The lacerations was cleansed and 4 x 4's and Kerlix was applied to the wounds. The lacerations was made by lancet that the patient stated was found in the room. Patient stated "  I got into an argument with girlfriend before I cut myself and I kept hearing voices in my head."  Patient stated last time he cut himself it was because him and his girlfriend got into argument and he would've cut his neck but she was able to stop him. Patient gave staff knife he had at bedside.  Advised the patient his personal belongings would be removed in order to ensure his safety.  Security came to assess the patient.  No items found.  Notified on call Tylene Fantasia, NP.   Will continue to monitor the patient and await new orders

## 2019-03-03 NOTE — Progress Notes (Signed)
PROGRESS NOTE    Donald Hartman  XBJ:[id number] DOB: [birth year] DOA: 02/28/2019 PCP: Patient, No Pcp Per   Brief Narrative:  HPI on 02/28/2019 by Dr. Earlie LouMohammad Garba Donald HenleDaniel Hartman is a 33 y.o. male with medical history significant of type 1 diabetes who was recently admitted with diabetic ketoacidosis.  Patient was discharged after social worker try to get him insulin and he is got prescription and all the social support we can Child psychotherapistmaster.  He was apparently [place] when [facility] with no medications has had multiple admissions in different hospitals for DKA since then.  Now he has decided to stay in [place].  Patient has had issues with suicidal ideation, abnormal behavior and aggressiveness.  Since he was discharged from the hospital he did not get his insulin.  He said he could not obtain it.  Came back again with blood sugar of about 800 with diabetic ketoacidosis.  He is having nausea vomiting with some abdominal discomfort.  Patient also having polyuria polydipsia and polyphagia.  Patient also has history of polysubstance abuse including injecting heroin.  He denied any suicidal or homicidal ideation at this point.  He is being readmitted with diabetic ketoacidosis.  Assessment & Plan   DKA secondary to medical noncompliance -DKA now resolved -Continue NovoLog 70/30, sliding scale -Appreciate diabetic coordinator -Case manager assisting with medication, patient [facility] pharmacy  Substance abuse -Patient was previously on Suboxone.  [facility] -[facility] go to [facility] for Suboxone treatment initiation  Mood disorder -Continue medications per psych previous admission, resume Abilify, trazodone, Wellbutrin, Zoloft -Patient now cutting himself overnight with CBG lancet.  Also told the nurse that he was hearing voices. -Psychiatry  consulted and appreciated  GERD -Continue Protonix  Hypokalemia -Continue to replace and monitor BMP  DVT Prophylaxis  lovenox  Code Status: Full  Family Communication: None at bedside  Disposition Plan: Admitted. Pending psych consult.   Consultants Psychiatry   Procedures  None  Antibiotics   Anti-infectives (From admission, onward)   None      Subjective:   Donald Hartman seen and examined today.  Not very interactive or talkative today.  Does not want to discuss the events that occurred last night.  Denies current chest pain, shortness of breath, abdominal pain, nausea or vomiting, diarrhea constipation, dizziness or headache.  Objective:   Vitals:   03/03/19 0610 03/03/19 0757 03/03/19 1535 03/03/19 1541  BP: 127/66 117/76 121/78   Pulse: 85 78 91   Resp: 18 18 17    Temp: 98.7 F (37.1 C) 98.2 F (36.8 C)  98 F (36.7 C)  TempSrc: Oral Oral  Oral  SpO2: 98% 98% 99%   Weight:      Height:        Intake/Output Summary (Last 24 hours) at 03/03/2019 1544 Last data filed at 03/02/2019 2332 Gross per 24 hour  Intake 240 ml  Output -  Net 240 ml   Filed Weights   02/28/19 1823  Weight: 72.6 kg    Exam  General: Well developed, well nourished, NAD, appears stated age  HEENT: NCAT, mucous membranes moist.   Neck: Supple  Cardiovascular: S1 S2 auscultated, RRR, no murmur  Respiratory: Clear to auscultation bilaterally with equal chest rise  Abdomen: Soft, nontender, nondistended, + bowel sounds  Extremities: warm dry without cyanosis clubbing or edema  Neuro: AAOx3, nonfocal  Skin: Without rashes exudates or nodules, right forearm with dressing in place  Psych: Flat   Data Reviewed: I have personally reviewed following labs and imaging studies  CBC: Recent Labs  Lab 02/25/19 0505 02/26/19 0552 02/28/19 1720 03/01/19 0150  WBC 5.2 4.8 4.6 4.7  NEUTROABS 2.3 2.0  --   --   HGB 12.5* 11.9* 12.0* 11.3*  HCT 39.0 37.7* 36.7* 34.1*   MCV 90.7 94.0 92.2 88.8  PLT PLATELET CLUMPS NOTED ON SMEAR, UNABLE TO ESTIMATE 232 258 440   Basic Metabolic Panel: Recent Labs  Lab 02/25/19 0505 02/26/19 0552 02/28/19 1720 03/01/19 0150 03/01/19 0623 03/02/19 0330 03/03/19 0715  NA 134* 137 131* 140 138 136 137  K 4.2 3.8 4.7 3.7 4.1 3.0* 3.4*  CL 97* 102 91* 104 102 100 101  CO2 27 27 20* 28 25 26 25   GLUCOSE 272* 235* 796* 100* 277* 231* 109*  BUN 10 11 14 10 12  <5* <5*  CREATININE 0.92 0.93 1.05 0.85 0.85 0.94 0.94  CALCIUM 8.7* 8.5* 9.0 8.4* 8.2* 8.6* 9.1  MG 1.8 2.0  --   --   --   --  1.6*  PHOS 4.0 4.8*  --   --   --   --   --    GFR: Estimated Creatinine Clearance: 111.8 mL/min (by C-G formula based on SCr of 0.94 mg/dL). Liver Function Tests: Recent Labs  Lab 02/25/19 0505 02/26/19 0552  AST 101* 81*  ALT 120* 106*  ALKPHOS 55 48  BILITOT 0.1* 0.3  PROT 5.9* 5.8*  ALBUMIN 3.2* 3.1*   No results for input(s): LIPASE, AMYLASE in the last 168 hours. No results for input(s): AMMONIA in the last 168 hours. Coagulation Profile: No results for input(s): INR, PROTIME in the last 168 hours. Cardiac Enzymes: No results for input(s): CKTOTAL, CKMB, CKMBINDEX, TROPONINI in the last 168 hours. BNP (last 3 results) No results for input(s): PROBNP in the last 8760 hours. HbA1C: No results for input(s): HGBA1C in the last 72 hours. CBG: Recent Labs  Lab 03/02/19 1634 03/02/19 2115 03/02/19 2222 03/03/19 0755 03/03/19 1144  GLUCAP 153* 60* 133* 108* 141*   Lipid Profile: No results for input(s): CHOL, HDL, LDLCALC, TRIG, CHOLHDL, LDLDIRECT in the last 72 hours. Thyroid Function Tests: No results for input(s): TSH, T4TOTAL, FREET4, T3FREE, THYROIDAB in the last 72 hours. Anemia Panel: No results for input(s): VITAMINB12, FOLATE, FERRITIN, TIBC, IRON, RETICCTPCT in the last 72 hours. Urine analysis:    Component Value Date/Time   COLORURINE COLORLESS (A) 02/28/2019 1907   APPEARANCEUR CLEAR 02/28/2019  1907   LABSPEC 1.025 02/28/2019 1907   PHURINE 5.0 02/28/2019 1907   GLUCOSEU >=500 (A) 02/28/2019 [place] NEGATIVE 02/28/2019 [place] NEGATIVE 02/28/2019 1907   KETONESUR 20 (A) 02/28/2019 1907   PROTEINUR NEGATIVE 02/28/2019 1907   NITRITE NEGATIVE 02/28/2019 1907   LEUKOCYTESUR NEGATIVE 02/28/2019 1907   Sepsis Labs: @LABRCNTIP (procalcitonin:4,lacticidven:4)  ) Recent Results (from the past 240 hour(s))  SARS Coronavirus 2 (CEPHEID - Performed in [place] hospital lab), Hosp Order     Status: None   Collection Time: 02/28/19  8:25 PM   Specimen: Nasopharyngeal Swab  Result Value Ref Range Status   SARS Coronavirus 2 NEGATIVE NEGATIVE Final    Comment: (NOTE) If result is NEGATIVE SARS-CoV-2 target nucleic acids are NOT DETECTED. The SARS-CoV-2 RNA is generally detectable in upper and lower  respiratory specimens during the acute phase  of infection. The lowest  concentration of SARS-CoV-2 viral copies this assay can detect is 250  copies / mL. A negative result does not preclude SARS-CoV-2 infection  and should not be used as the sole basis for treatment or other  patient management decisions.  A negative result may occur with  improper specimen collection / handling, submission of specimen other  than nasopharyngeal swab, presence of viral mutation(s) within the  areas targeted by this assay, and inadequate number of viral copies  (<250 copies / mL). A negative result must be combined with clinical  observations, patient history, and epidemiological information. If result is POSITIVE SARS-CoV-2 target nucleic acids are DETECTED. The SARS-CoV-2 RNA is generally detectable in upper and lower  respiratory specimens dur ing the acute phase of infection.  Positive  results are indicative of active infection with SARS-CoV-2.  Clinical  correlation with patient history and other diagnostic information is  necessary to determine patient infection status.   Positive results do  not rule out bacterial infection or co-infection with other viruses. If result is PRESUMPTIVE POSTIVE SARS-CoV-2 nucleic acids MAY BE PRESENT.   A presumptive positive result was obtained on the submitted specimen  and confirmed on repeat testing.  While 2019 novel coronavirus  (SARS-CoV-2) nucleic acids may be present in the submitted sample  additional confirmatory testing may be necessary for epidemiological  and / or clinical management purposes  to differentiate between  SARS-CoV-2 and other Sarbecovirus currently known to infect humans.  If clinically indicated additional testing with an alternate test  methodology [phone] is advised. The SARS-CoV-2 RNA is generally  detectable in upper and lower respiratory sp ecimens during the acute  phase of infection. The expected result is Negative. Fact Sheet for Patients:  BoilerBrush.com.cyhttps://www.fda.gov/media/136312/download Fact Sheet for Healthcare Providers: https://pope.com/https://www.fda.gov/media/136313/download This test is not yet approved or cleared by the [place] FDA and has been authorized for detection and/or diagnosis of SARS-CoV-2 by FDA under an Emergency Use Authorization (EUA).  This EUA will remain in effect (meaning this test can be used) for the duration of the COVID-19 declaration under Section 564(b)(1) of the Act, 21 U.S.C. section 360bbb-3(b)(1), unless the authorization is terminated or revoked sooner. Performed at [facility], [address] [place], [place] [address]       Radiology Studies: No results found.   Scheduled Meds: . ARIPiprazole  5 mg Oral Daily  . buprenorphine-naloxone  1 tablet Sublingual BID  . buPROPion  300 mg Oral Daily  . enoxaparin (LOVENOX) injection  40 mg Subcutaneous Q24H  . insulin aspart  0-15 Units Subcutaneous TID WC  . insulin aspart protamine- aspart  35 Units Subcutaneous BID WC  . pantoprazole  40 mg Oral BID AC  . potassium chloride  40 mEq Oral Once  .  sertraline  100 mg Oral Daily   Continuous Infusions:   LOS: 3 days   Time Spent in minutes   30 minutes  Taft Hartman D.O. on 03/03/2019 at 3:44 PM  Between 7am to 7pm - Please see pager noted on [email or web address]  After 7pm go to [email or web address]  And look for the night coverage person covering for me after hours  [facility] Office  [phone]

## 2019-03-03 NOTE — Progress Notes (Signed)
Patient is very upset with the sitter in the room and stating he's getting bad energy.  Patient told nurse that " I have something else hidden, just in case I need it." Security was called and patients' room was searched again.  Apparently patient was referring to the sharps box.  Sharps box removed.  Patient has been redirected into the bed and sitting calmly. Will continue to monitor the patient and notify as needed.

## 2019-03-04 DIAGNOSIS — F4325 Adjustment disorder with mixed disturbance of emotions and conduct: Secondary | ICD-10-CM

## 2019-03-04 LAB — BASIC METABOLIC PANEL
Anion gap: 11 (ref 5–15)
BUN: 5 mg/dL — ABNORMAL LOW (ref 6–20)
CO2: 26 mmol/L (ref 22–32)
Calcium: 9 mg/dL (ref 8.9–10.3)
Chloride: 100 mmol/L (ref 98–111)
Creatinine, Ser: 1.06 mg/dL (ref 0.61–1.24)
GFR calc Af Amer: >60 mL/min (ref >60)
GFR calc non Af Amer: >60 mL/min (ref >60)
Glucose, Bld: 393 mg/dL — ABNORMAL HIGH (ref 70–99)
Potassium: 4 mmol/L (ref 3.5–5.1)
Sodium: 137 mmol/L (ref 135–145)

## 2019-03-04 LAB — GLUCOSE, CAPILLARY
Glucose-Capillary: 277 mg/dL — ABNORMAL HIGH (ref 70–99)
Glucose-Capillary: 137 mg/dL — ABNORMAL HIGH (ref 70–99)
Glucose-Capillary: 67 mg/dL — ABNORMAL LOW (ref 70–99)

## 2019-03-04 MED ORDER — BUPROPION HCL ER (XL) 450 MG PO TB24: 450.0000 mg | ORAL_TABLET | Freq: Every day | ORAL | 0 refills | Status: DC

## 2019-03-04 MED ORDER — TRUE METRIX BLOOD GLUCOSE TEST VI STRP: ORAL_STRIP | 1 refills | Status: DC

## 2019-03-04 MED ORDER — BUPROPION HCL ER (XL) 150 MG PO TB24
450.0000 mg | ORAL_TABLET | Freq: Every day | ORAL | Status: DC
  Administered 2019-03-04: 450 mg via ORAL
  Filled 2019-03-04: qty 3

## 2019-03-04 MED ORDER — TRUE METRIX METER W/DEVICE KIT: PACK | 0 refills | Status: DC

## 2019-03-04 MED ORDER — ARIPIPRAZOLE 5 MG PO TABS: 5.0000 mg | ORAL_TABLET | Freq: Every day | ORAL | 0 refills | Status: DC

## 2019-03-04 MED FILL — !TRUE METRIX BLOOD GLUCOSE: 365 days supply | Qty: 1 | Fill #0

## 2019-03-04 MED FILL — TRUE METRIX TEST STRIP: 25 days supply | Qty: 100 | Fill #0

## 2019-03-04 MED FILL — ARIPiprazole 5 MG TABS: 5 | 30 days supply | Qty: 30 | Fill #0
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
## 2019-03-04 NOTE — TOC Progression Note (Signed)
Transition of Care [facility]) - Progression Note    Patient Details  Name: Tyrone Pautsch MRN: [id number] Date of Birth: 1985-10-26  Transition of Care [facility]) CM/SW [place], LCSW Phone Number: 03/04/2019, 9:08 AM  Clinical Narrative:    CSW left voicemails for [facility] and [employer] regarding shelter availability.    Expected Discharge Plan: Home/Self Care Barriers to Discharge: No Barriers Identified  Expected Discharge Plan and Services Expected Discharge Plan: Home/Self Care In-house Referral: Clinical Social Work Discharge Planning Services: CM Consult Post Acute Care Choice: NA Living arrangements for the past 2 months: No permanent address, Homeless Expected Discharge Date: 03/04/19               DME Arranged: N/A         HH Arranged: NA           Social Determinants of Health (SDOH) Interventions    Readmission Risk Interventions Readmission Risk Prevention Plan 03/02/2019  Transportation Screening Complete  PCP or Specialist Appt within 3-5 Days Complete  HRI or [place] Complete  Social Work Consult for [place] Planning/Counseling Complete  Palliative Care Screening Not Applicable  Medication Review Press photographer) Referral to Pharmacy

## 2019-03-04 NOTE — Progress Notes (Signed)
Nsg Discharge Note  Admit Date:  02/28/2019 Discharge date: 03/04/2019   Donald Hartman to be D/C'd Home per MD order.  AVS completed.  Copy for chart, and copy for patient signed, and dated. Patient/caregiver able to verbalize understanding.  Discharge Medication: Allergies as of 03/04/2019   No Known Allergies     Medication List    TAKE these medications   ARIPiprazole 5 MG tablet Commonly known as: ABILIFY Take 1 tablet (5 mg total) by mouth daily. Notes to patient: 03/05/2019   blood glucose meter kit and supplies Kit Dispense based on patient and insurance preference. Use up to four times daily as directed. (FOR ICD-9 250.00, 250.01).   buprenorphine 8 MG Subl SL tablet Commonly known as: SUBUTEX Place 8 mg under the tongue 2 (two) times a day. Notes to patient: 03/05/2019   buPROPion HCl ER (XL) 450 MG Tb24 Take 450 mg by mouth daily. Start taking on: March 05, 2019 What changed:   medication strength  how much to take Notes to patient: 03/05/2019   insulin aspart 100 UNIT/ML injection Commonly known as: novoLOG Inject 0-20 Units into the skin 3 (three) times daily with meals.   insulin aspart protamine- aspart (70-30) 100 UNIT/ML injection Commonly known as: NOVOLOG MIX 70/30 Inject 0.42 mLs (42 Units total) into the skin 2 (two) times daily with a meal.   Insulin Syringe-Needle U-100 30G X 15/64" 0.5 ML Misc 1 Syringe by Does not apply route daily. 1 Insulin syringe-needle, 5 times daily   iron polysaccharides 150 MG capsule Commonly known as: NIFEREX Take 1 capsule (150 mg total) by mouth daily. Notes to patient: 03/05/2019   sertraline 100 MG tablet Commonly known as: ZOLOFT Take 1 tablet (100 mg total) by mouth daily. Notes to patient: 03/05/2019   traZODone 100 MG tablet Commonly known as: DESYREL Take 1 tablet (100 mg total) by mouth at bedtime as needed for sleep.   True Metrix Blood Glucose Test test strip Generic drug: glucose blood Use as  instructed   True Metrix Meter w/Device Kit Use to check your blood sugars       Discharge Assessment: Vitals:   03/04/19 0500 03/04/19 1336  BP: 115/72 (!) 142/97  Pulse: 79 (!) 119  Resp: 18 16  Temp: 98.4 F (36.9 C)   SpO2: 98% 99%   Skin clean, dry and intact without evidence of skin break down, no evidence of skin tears noted. IV catheter discontinued intact. Site without signs and symptoms of complications - no redness or edema noted at insertion site, patient denies c/o pain - only slight tenderness at site.  Dressing with slight pressure applied.  D/c Instructions-Education: Discharge instructions given to patient/family with verbalized understanding. D/c education completed with patient/family including follow up instructions, medication list, d/c activities limitations if indicated, with other d/c instructions as indicated by MD - patient able to verbalize understanding, all questions fully answered. Patient instructed to return to ED, call 911, or call MD for any changes in condition.  Patient escorted via WC, and D/C home.   S , RN 03/04/2019 4:40 PM  
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
## 2019-03-04 NOTE — Discharge Instructions (Signed)
Control de la glucemia, en adultos Blood Glucose Monitoring, Adult Controlar el nivel de azcar en la sangre (glucosa) es una parte importante del tratamiento de su diabetes (diabetes mellitus). El control de la glucemia implica realizar controles regulares segn le indiquen, y Catering manager un registro de los resultados (registro diario) a lo largo del [place]. Controlar la glucemia en forma peridica y llevar un registro diario puede:  Ayudarlos a usted y al mdico a Programmer, applications de control de la diabetes segn sea necesario, lo que incluye medicamentos o insulina.  Ayudarlo a usted a comprender de [employer], la actividad fsica, las enfermedades y los medicamentos inciden en la glucemia.  Permitirle a usted saber cul es su nivel de glucemia en cualquier momento. Averiguar rpidamente si su nivel de glucemia es bajo (hipoglucemia) o alto (hiperglucemia). El [employer] objetivos personalizados de su tratamiento. Los objetivos estarn basados en su edad, en otras enfermedades que tenga y en cmo responde al tratamiento de la diabetes. Generalmente, el objetivo del tratamiento es [employer] siguientes niveles de glucemia:  Antes de las comidas (preprandial): de 80 a 130mg /dl (4,4 a 7,66mmol/l).  Despus de las comidas (posprandial): por debajo de 180mg /dl (76mmol/l).  Nivel de A1c: menos del 7%. Materiales necesarios:  Medidor de glucemia.  Tiras reactivas para el medidor. Cada medidor tiene sus propias tiras reactivas. Debe usar las tiras reactivas que trajo su medidor.  Una aguja para pincharse el dedo (lanceta). No use una lanceta ms de una vez.  Un dispositivo que sujeta la lanceta (dispositivo de puncin).  Un diario o cuaderno de anotaciones para Pensions consultant. Cmo controlar su glucemia  1. Lvese las manos con agua y [place]. 2. Pnchese el costado del dedo (no en la punta) con la lanceta. Use un dedo diferente cada vez. 3. Frote suavemente  el dedo hasta que aparezca una pequea gota de [place]. 4. Siga las instrucciones que vienen con el medidor para Garment/textile technologist tira Comptroller, Midwife la sangre sobre la tira y usar el medidor de glucemia. 5. Registre su resultado y las observaciones que desee. Algunos medidores le permiten tomar sangre para la prueba de otras zonas del cuerpo que no son el dedo (zonas alternativas). Las zonas alternativas ms comunes son las siguientes:  Los Field seismologist.  Los muslos.  La palma de la mano. Si cree que puede tener hipoglucemia, o si tiene antecedentes de no saber cundo le est bajando el nivel de glucemia (hipoglucemia asintomtica), no use zonas alternativas. En su lugar, use los dedos. Es posible que las zonas alternativas no sean tan precisas como los dedos porque el flujo de sangre es ms lento en esas zonas. Esto significa que el resultado que obtiene de estas zonas puede estar retrasado y ser un poco diferente del resultado que obtendra de su dedo. Siga estas indicaciones en su casa: Registro diario de glucemia   Cada vez que controle su nivel de glucemia, anote el resultado. Tambin anote aquellos factores que puedan estar afectando su nivel de glucemia, como la dieta y la actividad fsica Designer, multimedia. Esta informacin puede ayudarlos a usted y a su mdico a: ? Charity fundraiser patrones en su glucemia durante el transcurso del [place]. ? Ajustar su plan de control de la diabetes segn sea necesario.  Averige si su Research officer, trade union sus registros en una computadora. [employer] de los medidores de glucosa guardan un registro de las lecturas realizadas con el medidor. Si usted tiene diabetes tipo  1:  Controle su nivel de glucemia 2 o ms veces al da.  Tambin controle su nivel de glucemia: ? Antes de cada inyeccin de insulina. ? Antes y despus de hacer ejercicio. ? Antes de comer. ? 2 horas despus de una comida. ? Ocasionalmente, entre las 2:00a.m. y las 3:00a.m., como  se lo hayan indicado. ? Antes de Optometrist tareas peligrosas, English as a second language teacher o usar maquinaria pesada. ? A la hora de acostarse.  Es posible que Geophysicist/field seismologist con ms frecuencia sus niveles de glucemia, hasta 6 a 10veces por da, si: ? Usted [place] una bomba de insulina. ? Usted necesita mltiples inyecciones diarias (MDI, por sus siglas en ingls). ? Tiene diabetes que no est bien controlada. ? Usted est enfermo. ? Usted tiene antecedentes de hipoglucemia grave. ? Usted tiene hipoglucemia asintomtica. Si usted tiene diabetes tipo 2:  Si recibe insulina u otros medicamentos para la diabetes, controle su nivel de glucemia 2 o ms veces al Training and development officer.  Si recibe tratamiento intensivo con insulina, debe medirse el nivel de glucemia 4o ms veces al [employer]. Ocasionalmente, es posible que deba controlarlo entre las 2:00a.m. y las 3:00a.m., segn se lo indiquen.  Tambin controle su nivel de glucemia: ? Antes y despus de hacer ejercicio. ? Antes de Optometrist tareas peligrosas, English as a second language teacher o usar maquinaria pesada.  Es posible que deba controlar con ms frecuencia su nivel de glucemia si: ? Necesita ajustar la dosis de sus medicamentos. ? Su diabetes no est bien controlada. ? Est enfermo. Consejos generales  Siempre tenga sus insumos a mano.  Todos los medidores de glucemia incluyen un nmero de telfono "directo", disponible las 24 horas, al que podr llamar si tiene preguntas o [place]. Tambin puede consultar a su mdico.  Despus de usar algunas cajas de tiras reactivas, ajuste (calibre) el medidor de glucemia segn las instrucciones del medidor. Comunquese con un mdico si:  El nivel de glucemia es mayor o igual que 240mg /dl (13,53mmol/dl) durante 2das seguidos.  Ha estado enfermo o ha tenido fiebre durante ms de 2das y no mejora.  Tiene alguno de los siguientes problemas durante ms de 6horas: ? No puede comer ni beber. ? Tiene nuseas o vmitos. ? Tiene  diarrea. Solicite ayuda de inmediato si:  Su nivel de glucemia est por debajo de 54mg /dl (53mmol/l).  Se siente confundido o tiene dificultad para pensar con claridad.  Tiene dificultad para respirar.  Tiene un nivel moderado o alto de cetonas en la [place]. Resumen  Controlar el nivel de azcar en la sangre (glucosa) es una parte importante del tratamiento de su diabetes (diabetes mellitus).  El control de la glucemia implica realizar controles regulares segn le indiquen, y Catering manager un registro de los resultados (registro diario) a lo largo del [place].  El [employer] objetivos personalizados de su tratamiento. Los objetivos estarn basados en su edad, en otras enfermedades que tenga y en cmo responde al tratamiento de la diabetes.  Cada vez que controle su nivel de glucemia, anote el resultado. Tambin anote aquellos factores que puedan estar afectando su nivel de glucemia, como la dieta y la actividad fsica Designer, multimedia. Esta informacin no tiene Marine scientist el consejo del mdico. Asegrese de hacerle al mdico cualquier pregunta que tenga. Document Released: 07/29/2005 Document Revised: 11/07/2017 Document Reviewed: 01/08/2016 Elsevier Patient Education  2020 [employer].

## 2019-03-05 LAB — HEPATITIS C GENOTYPE: Hepatitis C Genotype: 3

## 2019-03-05 LAB — HCV RNA (INTERNATIONAL UNITS)
HCV log10: 7.212 log10 IU/mL
Hcv Rna (International Units): 16300000 IU/mL

## 2019-03-05 LAB — HCV RNA QUANT RFLX ULTRA OR GENOTYP

## 2019-03-17 ENCOUNTER — Ambulatory Visit: Payer: Self-pay | Admitting: Family Medicine

## 2019-03-24 ENCOUNTER — Ambulatory Visit: Payer: Self-pay | Admitting: Family Medicine

## 2019-03-24 ENCOUNTER — Inpatient Hospital Stay: Payer: Self-pay | Admitting: Family Medicine

## 2019-03-24 ENCOUNTER — Other Ambulatory Visit: Payer: Self-pay

## 2019-04-04 ENCOUNTER — Other Ambulatory Visit: Payer: Self-pay

## 2019-04-04 ENCOUNTER — Emergency Department (HOSPITAL_COMMUNITY)
Admission: EM | Admit: 2019-04-04 | Discharge: 2019-04-06 | Disposition: A | Discharge status: Home / Self Care | Payer: Self-pay | Attending: Emergency Medicine | Admitting: Emergency Medicine

## 2019-04-04 DIAGNOSIS — E1065 Type 1 diabetes mellitus with hyperglycemia: Secondary | ICD-10-CM | POA: Insufficient documentation

## 2019-04-04 DIAGNOSIS — Z79899 Other long term (current) drug therapy: Secondary | ICD-10-CM | POA: Insufficient documentation

## 2019-04-04 DIAGNOSIS — Z20828 Contact with and (suspected) exposure to other viral communicable diseases: Secondary | ICD-10-CM | POA: Insufficient documentation

## 2019-04-04 DIAGNOSIS — R739 Hyperglycemia, unspecified: Secondary | ICD-10-CM

## 2019-04-04 DIAGNOSIS — F1120 Opioid dependence, uncomplicated: Secondary | ICD-10-CM | POA: Insufficient documentation

## 2019-04-04 DIAGNOSIS — F333 Major depressive disorder, recurrent, severe with psychotic symptoms: Secondary | ICD-10-CM | POA: Insufficient documentation

## 2019-04-04 DIAGNOSIS — F1910 Other psychoactive substance abuse, uncomplicated: Secondary | ICD-10-CM

## 2019-04-04 DIAGNOSIS — F1020 Alcohol dependence, uncomplicated: Secondary | ICD-10-CM | POA: Insufficient documentation

## 2019-04-04 DIAGNOSIS — F17210 Nicotine dependence, cigarettes, uncomplicated: Secondary | ICD-10-CM | POA: Insufficient documentation

## 2019-04-04 DIAGNOSIS — R45851 Suicidal ideations: Secondary | ICD-10-CM | POA: Insufficient documentation

## 2019-04-04 LAB — CBC
HCT: 36.4 % — ABNORMAL LOW (ref 39.0–52.0)
Hemoglobin: 11.7 g/dL — ABNORMAL LOW (ref 13.0–17.0)
MCH: 29.4 pg (ref 26.0–34.0)
MCHC: 32.1 g/dL (ref 30.0–36.0)
MCV: 91.5 fL (ref 80.0–100.0)
Platelets: 232 10*3/uL (ref 150–400)
RBC: 3.98 MIL/uL — ABNORMAL LOW (ref 4.22–5.81)
RDW: 14.5 % (ref 11.5–15.5)
WBC: 8 10*3/uL (ref 4.0–10.5)
nRBC: 0 % (ref 0.0–0.2)

## 2019-04-04 LAB — URINALYSIS, ROUTINE W REFLEX MICROSCOPIC
Bacteria, UA: NONE SEEN
Bilirubin Urine: NEGATIVE
Glucose, UA: >=500 mg/dL — AB
Hgb urine dipstick: NEGATIVE
Ketones, ur: 5 mg/dL — AB
Leukocytes,Ua: NEGATIVE
Nitrite: NEGATIVE
Protein, ur: NEGATIVE mg/dL
Specific Gravity, Urine: 1.023 (ref 1.005–1.030)
pH: 6 (ref 5.0–8.0)

## 2019-04-04 LAB — POCT I-STAT EG7
Bicarbonate: 23.4 mmol/L (ref 20.0–28.0)
Calcium, Ion: 1.13 mmol/L — ABNORMAL LOW (ref 1.15–1.40)
HCT: 39 % (ref 39.0–52.0)
Hemoglobin: 13.3 g/dL (ref 13.0–17.0)
O2 Saturation: 100 %
Potassium: 3.6 mmol/L (ref 3.5–5.1)
Sodium: 134 mmol/L — ABNORMAL LOW (ref 135–145)
TCO2: 24 mmol/L (ref 22–32)
pCO2, Ven: 33.6 mmHg — ABNORMAL LOW (ref 44.0–60.0)
pH, Ven: 7.451 — ABNORMAL HIGH (ref 7.250–7.430)
pO2, Ven: 157 mmHg — ABNORMAL HIGH (ref 32.0–45.0)

## 2019-04-04 LAB — BASIC METABOLIC PANEL
Anion gap: 14 (ref 5–15)
BUN: 8 mg/dL (ref 6–20)
CO2: 21 mmol/L — ABNORMAL LOW (ref 22–32)
Calcium: 8.3 mg/dL — ABNORMAL LOW (ref 8.9–10.3)
Chloride: 95 mmol/L — ABNORMAL LOW (ref 98–111)
Creatinine, Ser: 0.93 mg/dL (ref 0.61–1.24)
GFR calc Af Amer: >60 mL/min (ref >60)
GFR calc non Af Amer: >60 mL/min (ref >60)
Glucose, Bld: 529 mg/dL (ref 70–99)
Potassium: 3.4 mmol/L — ABNORMAL LOW (ref 3.5–5.1)
Sodium: 130 mmol/L — ABNORMAL LOW (ref 135–145)

## 2019-04-04 LAB — CBG MONITORING, ED: Glucose-Capillary: 433 mg/dL — ABNORMAL HIGH (ref 70–99)

## 2019-04-04 MED ORDER — LACTATED RINGERS IV BOLUS
1000.0000 mL | Freq: Once | INTRAVENOUS | Status: AC
  Administered 2019-04-05: 1000 mL via INTRAVENOUS

## 2019-04-04 MED ORDER — LACTATED RINGERS IV BOLUS
1000.0000 mL | Freq: Once | INTRAVENOUS | Status: AC
  Administered 2019-04-04: 1000 mL via INTRAVENOUS

## 2019-04-04 MED ORDER — CLONIDINE HCL 0.2 MG PO TABS: 0.1000 mg | ORAL_TABLET | ORAL | Status: DC

## 2019-04-04 MED ORDER — INSULIN ASPART PROT & ASPART (70-30 MIX) 100 UNIT/ML ~~LOC~~ SUSP
42.0000 [IU] | Freq: Two times a day (BID) | SUBCUTANEOUS | Status: DC
  Administered 2019-04-05 – 2019-04-06 (×3): 42 [IU] via SUBCUTANEOUS

## 2019-04-04 MED ORDER — INSULIN ASPART 100 UNIT/ML ~~LOC~~ SOLN
10.0000 [IU] | Freq: Once | SUBCUTANEOUS | Status: AC
  Administered 2019-04-04: 22:00:00 10 [IU] via SUBCUTANEOUS

## 2019-04-04 MED ORDER — CLONIDINE HCL 0.2 MG PO TABS
0.1000 mg | ORAL_TABLET | Freq: Four times a day (QID) | ORAL | Status: DC
  Administered 2019-04-05 (×2): 0.1 mg via ORAL
  Filled 2019-04-04 (×2): qty 1

## 2019-04-04 MED ORDER — DICYCLOMINE HCL 20 MG PO TABS: 20.0000 mg | ORAL_TABLET | Freq: Four times a day (QID) | ORAL | Status: DC | PRN

## 2019-04-04 MED ORDER — BUPROPION HCL ER (XL) 150 MG PO TB24
450.0000 mg | ORAL_TABLET | Freq: Every day | ORAL | Status: DC
  Administered 2019-04-05 – 2019-04-06 (×2): 450 mg via ORAL
  Filled 2019-04-04 (×2): qty 3

## 2019-04-04 MED ORDER — NAPROXEN 250 MG PO TABS: 500.0000 mg | ORAL_TABLET | Freq: Two times a day (BID) | ORAL | Status: DC | PRN

## 2019-04-04 MED ORDER — TRAZODONE HCL 100 MG PO TABS
100.0000 mg | ORAL_TABLET | Freq: Every evening | ORAL | Status: DC | PRN
  Administered 2019-04-05: 100 mg via ORAL
  Filled 2019-04-04: qty 1

## 2019-04-04 MED ORDER — LOPERAMIDE HCL 2 MG PO CAPS: 2.0000 mg | ORAL_CAPSULE | ORAL | Status: DC | PRN

## 2019-04-04 MED ORDER — ARIPIPRAZOLE 5 MG PO TABS
5.0000 mg | ORAL_TABLET | Freq: Every day | ORAL | Status: DC
  Administered 2019-04-05 – 2019-04-06 (×2): 5 mg via ORAL
  Filled 2019-04-04 (×2): qty 1

## 2019-04-04 MED ORDER — METOCLOPRAMIDE HCL 5 MG/ML IJ SOLN
10.0000 mg | Freq: Once | INTRAMUSCULAR | Status: AC
  Administered 2019-04-04: 10 mg via INTRAVENOUS
  Filled 2019-04-04: qty 2

## 2019-04-04 MED ORDER — POLYSACCHARIDE IRON COMPLEX 150 MG PO CAPS
150.0000 mg | ORAL_CAPSULE | Freq: Every day | ORAL | Status: DC
  Administered 2019-04-05 – 2019-04-06 (×2): 150 mg via ORAL
  Filled 2019-04-04 (×2): qty 1

## 2019-04-04 MED ORDER — METHOCARBAMOL 500 MG PO TABS
500.0000 mg | ORAL_TABLET | Freq: Three times a day (TID) | ORAL | Status: DC | PRN
  Administered 2019-04-05: 500 mg via ORAL
  Filled 2019-04-04: qty 1

## 2019-04-04 MED ORDER — INSULIN ASPART PROT & ASPART (70-30 MIX) 100 UNIT/ML ~~LOC~~ SUSP
32.0000 [IU] | Freq: Once | SUBCUTANEOUS | Status: AC
  Administered 2019-04-05: 32 [IU] via SUBCUTANEOUS
  Filled 2019-04-04: qty 10

## 2019-04-04 MED ORDER — CLONIDINE HCL 0.2 MG PO TABS: 0.1000 mg | ORAL_TABLET | Freq: Every day | ORAL | Status: DC

## 2019-04-04 MED ORDER — HYDROXYZINE HCL 25 MG PO TABS
25.0000 mg | ORAL_TABLET | Freq: Four times a day (QID) | ORAL | Status: DC | PRN
  Administered 2019-04-05 (×3): 25 mg via ORAL
  Filled 2019-04-04 (×3): qty 1

## 2019-04-04 MED ORDER — INSULIN ASPART 100 UNIT/ML ~~LOC~~ SOLN: 0.0000 [IU] | Freq: Three times a day (TID) | SUBCUTANEOUS | Status: DC

## 2019-04-04 MED ORDER — ONDANSETRON 4 MG PO TBDP
4.0000 mg | ORAL_TABLET | Freq: Four times a day (QID) | ORAL | Status: DC | PRN
  Administered 2019-04-05: 4 mg via ORAL
  Filled 2019-04-04: qty 1

## 2019-04-04 MED ORDER — SERTRALINE HCL 100 MG PO TABS
100.0000 mg | ORAL_TABLET | Freq: Every day | ORAL | Status: DC
  Administered 2019-04-05 – 2019-04-06 (×2): 100 mg via ORAL
  Filled 2019-04-04 (×2): qty 1

## 2019-04-04 MED ORDER — POTASSIUM CHLORIDE CRYS ER 20 MEQ PO TBCR
40.0000 meq | EXTENDED_RELEASE_TABLET | Freq: Once | ORAL | Status: AC
  Administered 2019-04-05: 40 meq via ORAL
  Filled 2019-04-04: qty 2

## 2019-04-04 NOTE — ED Triage Notes (Signed)
Pt presents from home via POV, ran out of insulin 5 days ago, takes novolog. Experiencing hyperglycemia, polyuria, polydipsia, foot cramps, blurry vision, weakness, cp today related to this. Reports episode of near-syncope 3 days ago   Negative sob  H/o type 1 diabetes

## 2019-04-04 NOTE — ED Notes (Signed)
Cbg: 433mg /dL

## 2019-04-04 NOTE — ED Provider Notes (Signed)
T1 DM, withdrawal from opiates now and hyperglycemic New to area, no outpatient follow up.  And has SI, previous attempt No evidence DKA  Plan - repeat bmet to insure improvement to CBG and no acidosis - on second liter now. No vomiting.  Nausea, diarrhea, anxious (withdrawal). On Clonidine protocol.   TTS pending - found to meet inpatient criteria.   Will need re-evaluation to medically clear.   Insulin sliding scale initiated.   2:00 - Patient has a new cut on his left forearm. He states he has a razor in his mouth and will swallow it if he is not given Suboxone for his opiate withdrawal pain.   Security at bedside. The patient voluntarily surrenders the razor. Suboxone use researched. The patient has received this in hospital during a medically supervised withdrawal. [facility] confirms they do not use this in their facility.   VSS. He is awake, alert. No vomiting. No significant diarrhea. Suboxone is not indicated. He is already on the clonidine protocol without relief. Ativan ordered, 2 mg. Will reassess. IVC Petition filled out and filed as patient is considered a flight risk and in need of inpatient psychiatric treatment.   5:00 - patient still pacing, unable to sleep. He is eating and drinking. No further escalations in behavior.   Patient left in the care on oncoming treatment team pending placement. CBG will need monitoring and evaluation prior to departure.   Charlann Lange, PA-C 04/05/19 5035    Charlesetta Shanks, MD 04/21/19 [id number]
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
## 2019-04-05 ENCOUNTER — Encounter (HOSPITAL_COMMUNITY): Payer: Self-pay | Admitting: Oncology

## 2019-04-05 LAB — BASIC METABOLIC PANEL
Anion gap: 12 (ref 5–15)
BUN: 9 mg/dL (ref 6–20)
CO2: 19 mmol/L — ABNORMAL LOW (ref 22–32)
Calcium: 8.6 mg/dL — ABNORMAL LOW (ref 8.9–10.3)
Chloride: 101 mmol/L (ref 98–111)
Creatinine, Ser: 0.8 mg/dL (ref 0.61–1.24)
GFR calc Af Amer: >60 mL/min (ref >60)
GFR calc non Af Amer: >60 mL/min (ref >60)
Glucose, Bld: 353 mg/dL — ABNORMAL HIGH (ref 70–99)
Potassium: 3.4 mmol/L — ABNORMAL LOW (ref 3.5–5.1)
Sodium: 132 mmol/L — ABNORMAL LOW (ref 135–145)

## 2019-04-05 LAB — CBG MONITORING, ED
Glucose-Capillary: 116 mg/dL — ABNORMAL HIGH (ref 70–99)
Glucose-Capillary: 175 mg/dL — ABNORMAL HIGH (ref 70–99)
Glucose-Capillary: 206 mg/dL — ABNORMAL HIGH (ref 70–99)
Glucose-Capillary: 255 mg/dL — ABNORMAL HIGH (ref 70–99)
Glucose-Capillary: 277 mg/dL — ABNORMAL HIGH (ref 70–99)
Glucose-Capillary: 329 mg/dL — ABNORMAL HIGH (ref 70–99)
Glucose-Capillary: 492 mg/dL — ABNORMAL HIGH (ref 70–99)
Glucose-Capillary: 517 mg/dL (ref 70–99)

## 2019-04-05 LAB — RAPID URINE DRUG SCREEN, HOSP PERFORMED
Amphetamines: NOT DETECTED
Barbiturates: NOT DETECTED
Benzodiazepines: NOT DETECTED
Cocaine: NOT DETECTED
Opiates: NOT DETECTED
Tetrahydrocannabinol: NOT DETECTED

## 2019-04-05 LAB — SALICYLATE LEVEL: Salicylate Lvl: <7 mg/dL (ref 2.8–30.0)

## 2019-04-05 LAB — ACETAMINOPHEN LEVEL: Acetaminophen (Tylenol), Serum: <10 ug/mL — ABNORMAL LOW (ref 10–30)

## 2019-04-05 LAB — SARS CORONAVIRUS 2 BY RT PCR (HOSPITAL ORDER, PERFORMED IN ~~LOC~~ HOSPITAL LAB): SARS Coronavirus 2: NEGATIVE

## 2019-04-05 LAB — ETHANOL: Alcohol, Ethyl (B): 82 mg/dL — ABNORMAL HIGH (ref <10)

## 2019-04-05 MED ORDER — BUPRENORPHINE HCL-NALOXONE HCL 8-2 MG SL SUBL
1.0000 | SUBLINGUAL_TABLET | Freq: Every day | SUBLINGUAL | Status: DC
  Administered 2019-04-05: 1 via SUBLINGUAL
  Filled 2019-04-05: qty 1

## 2019-04-05 MED ORDER — INSULIN ASPART 100 UNIT/ML ~~LOC~~ SOLN
9.0000 [IU] | Freq: Once | SUBCUTANEOUS | Status: AC
  Administered 2019-04-05: 10:00:00 9 [IU] via SUBCUTANEOUS

## 2019-04-05 MED ORDER — BUPRENORPHINE HCL-NALOXONE HCL 8-2 MG SL SUBL
1.0000 | SUBLINGUAL_TABLET | Freq: Once | SUBLINGUAL | Status: AC
  Administered 2019-04-05: 1 via SUBLINGUAL
  Filled 2019-04-05: qty 1

## 2019-04-05 MED ORDER — BUPRENORPHINE HCL-NALOXONE HCL 8-2 MG SL SUBL
1.0000 | SUBLINGUAL_TABLET | Freq: Two times a day (BID) | SUBLINGUAL | Status: DC
  Administered 2019-04-06: 09:00:00 1 via SUBLINGUAL
  Filled 2019-04-05: qty 1

## 2019-04-05 MED ORDER — INSULIN ASPART PROT & ASPART (70-30 MIX) 100 UNIT/ML ~~LOC~~ SUSP: 42.0000 [IU] | Freq: Two times a day (BID) | SUBCUTANEOUS | 0 refills | Status: DC

## 2019-04-05 MED ORDER — ZIPRASIDONE MESYLATE 20 MG IM SOLR
10.0000 mg | Freq: Once | INTRAMUSCULAR | Status: AC
  Administered 2019-04-05: 10 mg via INTRAMUSCULAR
  Filled 2019-04-05: qty 20

## 2019-04-05 MED ORDER — BUPRENORPHINE HCL-NALOXONE HCL 8-2 MG SL FILM
1.0000 | ORAL_FILM | Freq: Two times a day (BID) | SUBLINGUAL | Status: DC
  Filled 2019-04-05: qty 1

## 2019-04-05 MED ORDER — INSULIN ASPART 100 UNIT/ML ~~LOC~~ SOLN
0.0000 [IU] | Freq: Three times a day (TID) | SUBCUTANEOUS | Status: DC
  Administered 2019-04-05: 18:00:00 5 [IU] via SUBCUTANEOUS
  Administered 2019-04-06: 09:00:00 7 [IU] via SUBCUTANEOUS

## 2019-04-05 MED ORDER — INSULIN ASPART 100 UNIT/ML ~~LOC~~ SOLN
15.0000 [IU] | Freq: Once | SUBCUTANEOUS | Status: AC
  Administered 2019-04-05: 09:00:00 15 [IU] via SUBCUTANEOUS

## 2019-04-05 MED ORDER — INSULIN ASPART 100 UNIT/ML ~~LOC~~ SOLN: 15.0000 [IU] | Freq: Once | SUBCUTANEOUS | Status: DC

## 2019-04-05 MED ORDER — INSULIN ASPART 100 UNIT/ML ~~LOC~~ SOLN: 10.0000 [IU] | Freq: Once | SUBCUTANEOUS | Status: DC

## 2019-04-05 MED ORDER — LORAZEPAM 1 MG PO TABS
2.0000 mg | ORAL_TABLET | Freq: Once | ORAL | Status: AC
  Administered 2019-04-05: 07:00:00 2 mg via ORAL
  Filled 2019-04-05: qty 2

## 2019-04-05 MED ORDER — STERILE WATER FOR INJECTION IJ SOLN
INTRAMUSCULAR | Status: AC
  Filled 2019-04-05: qty 10

## 2019-04-05 MED ORDER — LORAZEPAM 1 MG PO TABS
2.0000 mg | ORAL_TABLET | Freq: Once | ORAL | Status: AC
  Administered 2019-04-05: 2 mg via ORAL
  Filled 2019-04-05: qty 2

## 2019-04-05 MED FILL — NOVOLOG MIX 70/30 VIAL: (70-30) 100 | 12 days supply | Qty: 10 | Fill #0

## 2019-04-05 NOTE — ED Notes (Signed)
Patient ate 100%of his breakfast drank 480

## 2019-04-05 NOTE — ED Notes (Signed)
Pt has been sleeping all afternoon, no complaints when aroused, goes back to sleep.

## 2019-04-05 NOTE — ED Notes (Signed)
[name] is 517mg .Donald Hartman

## 2019-04-05 NOTE — ED Notes (Signed)
Breakfast ordered--Donald Hartman  

## 2019-04-05 NOTE — ED Notes (Signed)
Patient denies pain and is resting comfortably.  

## 2019-04-05 NOTE — ED Notes (Signed)
paetient gotten breakfast.begain to eat

## 2019-04-05 NOTE — Progress Notes (Signed)
This patient continues to meet inpatient criteria. CSW faxed information to the following facilities:   [facility]- Has available beds, will review [facility]- No beds today, at [facility]- No beds today, at [facility] does not have appropriate bed availability at this time.  Stephanie Acre, LCSW-A Clinical Social Worker

## 2019-04-05 NOTE — TOC Progression Note (Signed)
Transition of Care [facility]) - Progression Note    Patient Details  Name: Donald Hartman MRN: [id number] Date of Birth: 1986/01/12  [facility]) CM/SW Contact  Fuller Mandril, RN Phone Number: 04/05/2019, 11:07 AM  Clinical Narrative:    Pt was enrolled in [facility] program with an over-ride for co-payment in June 2020; and will not qualify for reinstatement at this time.  EDCM suggests pt obtaining Rx at [facility] pharmacy or [facility].   Expected Discharge Plan: Homeless Shelter Barriers to Discharge: Homeless with medical needs, Continued Medical Work up  Expected Discharge Plan and Services Expected Discharge Plan: Homeless Shelter In-house Referral: NA Discharge Planning Services: CM Consult   Living arrangements for the past 2 months: Homeless Shelter Expected Discharge Date: 04/05/19                                     Social Determinants of Health (SDOH) Interventions    Readmission Risk Interventions Readmission Risk Prevention Plan 03/02/2019  Transportation Screening Complete  PCP or Specialist Appt within 3-5 Days Complete  HRI or [place] Complete  Social Work Consult for [place] Planning/Counseling Complete  Palliative Care Screening Not Applicable  Medication Review Press photographer) Referral to Pharmacy

## 2019-04-05 NOTE — ED Notes (Signed)
Donald Hartman(Lunch Tray Ordered) called @ 1106-per Hannie, RN called by Levada Dy

## 2019-04-05 NOTE — ED Notes (Signed)
Regular breakfast tray ordered.  

## 2019-04-05 NOTE — ED Notes (Signed)
[name] is 492mg 

## 2019-04-05 NOTE — ED Notes (Signed)
Pt walked to the bathroom with steady gait

## 2019-04-05 NOTE — ED Notes (Signed)
Pt's behavior began to escalate. Pt is adamant about his need for suboxone. Security had been to bedside x 2 to wand pt.  Pt came to door w/ what appeared to be a small razor blade close in appearance to a zipper w/ it placed to his wrist.  Pt stated, "Give me suboxone or I will cut myself." Security and provider summoned to bedside.  Pt then placed razor blade in mouth and stated he would swallow it.  Situation successfully deescalated. Pt removed razor blade from his mouth.  Pt is calm and cooperative at this time. Will continue to monitor.

## 2019-04-05 NOTE — TOC Initial Note (Signed)
Transition of Care [facility]) - Initial/Assessment Note    Patient Details  Name: Joshuan Bolander MRN: [id number] Date of Birth: [birth year]  [facility]) CM/SW Contact:    Fuller Mandril, RN Phone Number: 04/05/2019, 8:26 AM  Clinical Narrative:                 [facility] consulted regarding medication assistance for uninsured/homeless patient.  Expected Discharge Plan: Homeless Shelter Barriers to Discharge: Homeless with medical needs, Continued Medical Work up   Patient Goals and CMS Choice Patient states their goals for this hospitalization and ongoing recovery are:: get medicine to control my sugar      Expected Discharge Plan and Services Expected Discharge Plan: Homeless Shelter In-house Referral: NA Discharge Planning Services: CM Consult   Living arrangements for the past 2 months: Homeless Shelter Expected Discharge Date: 04/05/19                                    Prior Living Arrangements/Services Living arrangements for the past 2 months: Homeless Shelter Lives with:: Self Patient language and need for interpreter reviewed:: No        Need for Family Participation in Patient Care: No (Comment) Care giver support system in place?: No (comment)   Criminal Activity/Legal Involvement Pertinent to Current Situation/Hospitalization: No - Comment as needed  Activities of Daily Living Home Assistive Devices/Equipment: None ADL Screening (condition at time of admission) Patient's cognitive ability adequate to safely complete daily activities?: Yes Is the patient deaf or have difficulty hearing?: No Does the patient have difficulty seeing, even when wearing glasses/contacts?: No Does the patient have difficulty concentrating, remembering, or making decisions?: No Patient able to express need for assistance with ADLs?: Yes Does the patient have difficulty dressing or bathing?: No Independently performs ADLs?: Yes (appropriate for developmental age) Does  the patient have difficulty walking or climbing stairs?: No Weakness of Legs: None Weakness of Arms/Hands: None  Permission Sought/Granted                  Emotional Assessment Appearance:: Appears stated age, Disheveled Attitude/Demeanor/Rapport: Lethargic Affect (typically observed): Restless, Anxious Orientation: : Oriented to Self, Oriented to Place, Oriented to  Time, Oriented to Situation Alcohol / Substance Use: Illicit Drugs Psych Involvement: Yes (comment)  Admission diagnosis:  diabetic out of insulin Patient Active Problem List   Diagnosis Date Noted  . Adjustment disorder with mixed disturbance of emotions and conduct   . Substance induced mood disorder ([place])   . DKA, type 1 ([place]) 02/18/2019  . DKA (diabetic ketoacidoses) ([place]) 02/18/2019  . Opioid dependence with opioid-induced mood disorder ([place])   . MDD (major depressive disorder), recurrent episode, severe ([place]) 01/31/2019   PCP:  Patient, No Pcp Per Pharmacy:   [facility] [place], [address] [place] [address] Phone: [phone] Fax: [phone]  [id number] - [place], [place] [address] [place] [address] Phone: [phone] Fax: [place], [address] [place] [address] Phone: [phone] Fax: [place], [place] - [address] [place] [address] Phone: [phone] Fax: [phone]  Social Determinants of Health (SDOH) Interventions    Readmission Risk Interventions Readmission Risk Prevention Plan 03/02/2019  Transportation Screening Complete  PCP or Specialist Appt within 3-5 Days Complete  HRI or Home Care Consult Complete  Social Work Consult  for Recovery Care Planning/Counseling Complete  Palliative Care Screening Not Applicable  Medication Review Oceanographer(RN Care Manager) Referral to Pharmacy

## 2019-04-05 NOTE — ED Notes (Signed)
Pt removed all monitoring equipment. 

## 2019-04-05 NOTE — ED Notes (Signed)
TTS in progress 

## 2019-04-05 NOTE — ED Notes (Addendum)
Delay in lab draw,  Pt currently talking to TTS.

## 2019-04-05 NOTE — Progress Notes (Signed)
Inpatient Diabetes Program Recommendations  AACE/ADA: New Consensus Statement on Inpatient Glycemic Control (2015)  Target Ranges:  Prepandial:   less than 140 mg/dL      Peak postprandial:   less than 180 mg/dL (1-2 hours)      Critically ill patients:  140 - 180 mg/dL   Lab Results  Component Value Date   GLUCAP 492 (H) 04/05/2019   HGBA1C 10.4 (H) 01/28/2019    Review of Glycemic Control Results for Donald Hartman, Donald Hartman (MRN [id number]) as of 04/05/2019 10:23  Ref. Range 04/04/2019 18:52 04/05/2019 00:23 04/05/2019 05:00 04/05/2019 08:03 04/05/2019 09:20  Glucose-Capillary Latest Ref Range: 70 - 99 mg/dL 433 (H) 329 (H) 255 (H) 517 (HH) 492 (H)   Diabetes history: DM 1 Outpatient Diabetes medications:  70/30 42 units bid Current orders for Inpatient glycemic control:  Novolog sensitive tid with meals, Novolog 70/30 mix 42 units bid  Inpatient Diabetes Program Recommendations:    Agree with current orders.  May need to recheck BMP ?  Also please add A1C to labs.   It appears that patient ran out of insulin prior to admit.  Currently meets criteria for [facility] admission.  Will need to make sure that upon d/c from behavorial health facility, there is a plan in place for medications.  Of note, 70/30 can be purchased from [facility] (without a prescription) for 25$ a vial.  It appears that he was set-up with [facility] pharmacy/clinic upon d/c from hospital on  03/04/19.  He was also counseled by diabetes coordinator at that time.   Will follow.   Thanks  Adah Perl, RN, BC-ADM Inpatient Diabetes Coordinator Pager [phone] (8a-5p)

## 2019-04-05 NOTE — ED Notes (Signed)
Pt ambulatory to and from restroom with steady gait 
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
## 2019-04-05 NOTE — ED Notes (Signed)
GDP present, IVC papers signed
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
## 2019-04-05 NOTE — ED Provider Notes (Signed)
Care assumed from [facility] [place], at shift change, please see their notes for full documentation of patient's complaint/HPI. Briefly, pt here with hyperglycemia and suicidal ideation. Results so far show glucose level of 255 but no acidosis. He is currently on a sliding scale; CBG will need to be reevalauted prior to going out of the department. Pt currently IVC'd as he is a flight risk. TTS evaluated patient; he meets inpatient criteria. Awaiting placement.   Physical Exam  BP 125/88   Pulse 90   Temp 99.5 F (37.5 C) (Oral)   Resp 18   Ht 5\' 9"  (1.753 m)   Wt 74.4 kg   SpO2 100%   BMI 24.22 kg/m   Physical Exam  ED Course/Procedures     Procedures  MDM  CBG continues to be high.  Nursing staff informed this AM that CBG 517. Pt initially given 15 units; decreased to 492. Another 9 units ordered at this time. Diabetes coordinator evaluated patient; reports that he will need prescription for his Novolog sent to [place] upon discharge. Will send this over for him although he still meets inpatient criteria and awaiting [facility] placement.   While in the ED patient CBG has improved. Most recent 116. Pt awaiting placement. He is IVC'd.       Eustaquio Maize, PA-C 04/05/19 [place], DO 04/07/19 1510

## 2019-04-05 NOTE — ED Notes (Signed)
Change pt.bed with fitted sheet,gave warm blankets

## 2019-04-05 NOTE — TOC Transition Note (Signed)
Transition of Care [facility]) - CM/SW Discharge Note   Patient Details  Name: Donald Hartman MRN: [id number] Date of Birth: 1986/03/05  [facility] CM/SW Contact:  Fuller Mandril, RN Phone Number: 04/05/2019, 8:30 AM   Clinical Narrative:    [facility] met with pt at bedside.  Pt very restless as he is tossing and turning.  CNA/sitter states pt CBG>500.  [facility] discussed plan with EDP to follow-up after DM coordinator with Rx from [place].     Barriers to Discharge: Homeless with medical needs, Continued Medical Work up   Patient Goals and CMS Choice Patient states their goals for this hospitalization and ongoing recovery are:: get medicine to control my sugar      Discharge Placement                       Discharge Plan and Services In-house Referral: NA Discharge Planning Services: CM Consult                                 Social Determinants of Health (SDOH) Interventions     Readmission Risk Interventions Readmission Risk Prevention Plan 03/02/2019  Transportation Screening Complete  PCP or Specialist Appt within 3-5 Days Complete  HRI or [place] Complete  Social Work Consult for [place] Planning/Counseling Complete  Palliative Care Screening Not Applicable  Medication Review Press photographer) Referral to Pharmacy

## 2019-04-05 NOTE — ED Notes (Signed)
Per Marijean Bravo from [facility], pt meets inpatient criteria when medically clear. No bed available at [facility].

## 2019-04-06 ENCOUNTER — Encounter (HOSPITAL_COMMUNITY): Payer: Self-pay

## 2019-04-06 ENCOUNTER — Inpatient Hospital Stay (HOSPITAL_COMMUNITY)
Admission: AD | Admit: 2019-04-06 | Discharge: 2019-04-07 | DRG: 885 | Disposition: A | Discharge status: Home / Self Care | Payer: Federal, State, Local not specified - Other | Source: Intra-hospital | Attending: Psychiatry | Admitting: Psychiatry

## 2019-04-06 DIAGNOSIS — F17200 Nicotine dependence, unspecified, uncomplicated: Secondary | ICD-10-CM | POA: Diagnosis present

## 2019-04-06 DIAGNOSIS — Z20828 Contact with and (suspected) exposure to other viral communicable diseases: Secondary | ICD-10-CM | POA: Diagnosis present

## 2019-04-06 DIAGNOSIS — Z794 Long term (current) use of insulin: Secondary | ICD-10-CM | POA: Diagnosis not present

## 2019-04-06 DIAGNOSIS — F1010 Alcohol abuse, uncomplicated: Secondary | ICD-10-CM | POA: Diagnosis present

## 2019-04-06 DIAGNOSIS — Z590 Homelessness: Secondary | ICD-10-CM

## 2019-04-06 DIAGNOSIS — E119 Type 2 diabetes mellitus without complications: Secondary | ICD-10-CM | POA: Diagnosis present

## 2019-04-06 DIAGNOSIS — F1123 Opioid dependence with withdrawal: Secondary | ICD-10-CM | POA: Diagnosis present

## 2019-04-06 DIAGNOSIS — F332 Major depressive disorder, recurrent severe without psychotic features: Principal | ICD-10-CM | POA: Diagnosis present

## 2019-04-06 DIAGNOSIS — F1124 Opioid dependence with opioid-induced mood disorder: Secondary | ICD-10-CM | POA: Diagnosis not present

## 2019-04-06 DIAGNOSIS — R45851 Suicidal ideations: Secondary | ICD-10-CM | POA: Diagnosis present

## 2019-04-06 LAB — CBG MONITORING, ED
Glucose-Capillary: 329 mg/dL — ABNORMAL HIGH (ref 70–99)
Glucose-Capillary: 338 mg/dL — ABNORMAL HIGH (ref 70–99)

## 2019-04-06 LAB — GLUCOSE, CAPILLARY
Glucose-Capillary: 538 mg/dL (ref 70–99)
Glucose-Capillary: 470 mg/dL — ABNORMAL HIGH (ref 70–99)
Glucose-Capillary: 337 mg/dL — ABNORMAL HIGH (ref 70–99)

## 2019-04-06 MED ORDER — METHOCARBAMOL 500 MG PO TABS: 500.0000 mg | ORAL_TABLET | Freq: Three times a day (TID) | ORAL | Status: DC | PRN

## 2019-04-06 MED ORDER — ARIPIPRAZOLE 5 MG PO TABS: 5.0000 mg | ORAL_TABLET | Freq: Every day | ORAL | Status: DC

## 2019-04-06 MED ORDER — ONDANSETRON 4 MG PO TBDP
4.0000 mg | ORAL_TABLET | Freq: Four times a day (QID) | ORAL | Status: DC | PRN
  Administered 2019-04-06 (×2): 4 mg via ORAL
  Filled 2019-04-06: qty 1

## 2019-04-06 MED ORDER — ALUM & MAG HYDROXIDE-SIMETH 200-200-20 MG/5ML PO SUSP: 30.0000 mL | ORAL | Status: DC | PRN

## 2019-04-06 MED ORDER — TRAZODONE HCL 100 MG PO TABS
100.0000 mg | ORAL_TABLET | Freq: Every evening | ORAL | Status: DC | PRN
  Administered 2019-04-06: 21:00:00 100 mg via ORAL
  Filled 2019-04-06 (×2): qty 1

## 2019-04-06 MED ORDER — ONDANSETRON 8 MG PO TBDP
8.0000 mg | ORAL_TABLET | Freq: Three times a day (TID) | ORAL | Status: DC
  Administered 2019-04-07 (×2): 8 mg via ORAL
  Filled 2019-04-06 (×6): qty 1

## 2019-04-06 MED ORDER — LOPERAMIDE HCL 2 MG PO CAPS: 2.0000 mg | ORAL_CAPSULE | ORAL | Status: DC | PRN

## 2019-04-06 MED ORDER — SERTRALINE HCL 100 MG PO TABS
100.0000 mg | ORAL_TABLET | Freq: Every day | ORAL | Status: DC
  Administered 2019-04-07: 09:00:00 100 mg via ORAL
  Filled 2019-04-06: qty 7
  Filled 2019-04-06 (×2): qty 1

## 2019-04-06 MED ORDER — NAPROXEN 500 MG PO TABS
500.0000 mg | ORAL_TABLET | Freq: Two times a day (BID) | ORAL | Status: DC | PRN
  Administered 2019-04-06 – 2019-04-07 (×2): 500 mg via ORAL
  Filled 2019-04-06 (×2): qty 1

## 2019-04-06 MED ORDER — BUPRENORPHINE HCL-NALOXONE HCL 8-2 MG SL SUBL: 1.0000 | SUBLINGUAL_TABLET | Freq: Two times a day (BID) | SUBLINGUAL | Status: DC

## 2019-04-06 MED ORDER — METHOCARBAMOL 500 MG PO TABS
500.0000 mg | ORAL_TABLET | Freq: Three times a day (TID) | ORAL | Status: DC | PRN
  Administered 2019-04-06 – 2019-04-07 (×2): 500 mg via ORAL
  Filled 2019-04-06 (×2): qty 1

## 2019-04-06 MED ORDER — HYDROXYZINE HCL 25 MG PO TABS: 25.0000 mg | ORAL_TABLET | Freq: Four times a day (QID) | ORAL | Status: DC | PRN

## 2019-04-06 MED ORDER — HYDROXYZINE HCL 25 MG PO TABS
25.0000 mg | ORAL_TABLET | Freq: Four times a day (QID) | ORAL | Status: DC | PRN
  Administered 2019-04-06 (×2): 25 mg via ORAL
  Filled 2019-04-06 (×3): qty 1

## 2019-04-06 MED ORDER — MAGNESIUM HYDROXIDE 400 MG/5ML PO SUSP: 30.0000 mL | Freq: Every day | ORAL | Status: DC | PRN

## 2019-04-06 MED ORDER — DICYCLOMINE HCL 20 MG PO TABS: 20.0000 mg | ORAL_TABLET | Freq: Four times a day (QID) | ORAL | Status: DC | PRN

## 2019-04-06 MED ORDER — INSULIN ASPART 100 UNIT/ML ~~LOC~~ SOLN
0.0000 [IU] | Freq: Three times a day (TID) | SUBCUTANEOUS | Status: DC
  Administered 2019-04-06: 12 [IU] via SUBCUTANEOUS
  Administered 2019-04-07 (×2): 9 [IU] via SUBCUTANEOUS

## 2019-04-06 MED ORDER — CLONIDINE HCL 0.1 MG PO TABS: 0.1000 mg | ORAL_TABLET | Freq: Every day | ORAL | Status: DC

## 2019-04-06 MED ORDER — NAPROXEN 500 MG PO TABS: 500.0000 mg | ORAL_TABLET | Freq: Two times a day (BID) | ORAL | Status: DC | PRN

## 2019-04-06 MED ORDER — BUPROPION HCL ER (XL) 300 MG PO TB24: 450.0000 mg | ORAL_TABLET | Freq: Every day | ORAL | Status: DC

## 2019-04-06 MED ORDER — ACETAMINOPHEN 325 MG PO TABS: 650.0000 mg | ORAL_TABLET | Freq: Four times a day (QID) | ORAL | Status: DC | PRN

## 2019-04-06 MED ORDER — CLONIDINE HCL 0.1 MG PO TABS
0.1000 mg | ORAL_TABLET | Freq: Four times a day (QID) | ORAL | Status: DC
  Administered 2019-04-06 – 2019-04-07 (×5): 0.1 mg via ORAL
  Filled 2019-04-06 (×9): qty 1

## 2019-04-06 MED ORDER — POLYSACCHARIDE IRON COMPLEX 150 MG PO CAPS
150.0000 mg | ORAL_CAPSULE | Freq: Every day | ORAL | Status: DC
  Administered 2019-04-07: 09:00:00 150 mg via ORAL
  Filled 2019-04-06: qty 1
  Filled 2019-04-06: qty 7
  Filled 2019-04-06: qty 1

## 2019-04-06 MED ORDER — CLONIDINE HCL 0.1 MG PO TABS
0.1000 mg | ORAL_TABLET | ORAL | Status: DC
  Filled 2019-04-06 (×4): qty 1

## 2019-04-06 MED ORDER — INSULIN ASPART PROT & ASPART (70-30 MIX) 100 UNIT/ML ~~LOC~~ SUSP
42.0000 [IU] | Freq: Two times a day (BID) | SUBCUTANEOUS | Status: DC
  Administered 2019-04-06 – 2019-04-07 (×2): 42 [IU] via SUBCUTANEOUS
  Filled 2019-04-06 (×2): qty 0.42

## 2019-04-06 MED ORDER — QUETIAPINE FUMARATE 100 MG PO TABS
100.0000 mg | ORAL_TABLET | Freq: Three times a day (TID) | ORAL | Status: DC
  Administered 2019-04-06 – 2019-04-07 (×4): 100 mg via ORAL
  Filled 2019-04-06 (×5): qty 1
  Filled 2019-04-06 (×2): qty 21
  Filled 2019-04-06: qty 1
  Filled 2019-04-06: qty 21
  Filled 2019-04-06: qty 1

## 2019-04-06 NOTE — BH Assessment (Signed)
Patient has been accepted to [facility], bed 302-2; Accepting provider is Dr. Myles Lipps, MD. Patient can arrive at 11:00am. Please call report at [phone].    Eber Hong, RN notified  Radonna Ricker, MSW, [place] Clinical Social Worker [facility]  Phone: [phone]

## 2019-04-06 NOTE — Care Management (Signed)
CMA sent referral to [facility].  CMA will follow up.    CMA will notify CSW, Ardelle Anton.     Domique Reardon Care Management Assistant  Email:[email or web address] Office: [phone]

## 2019-04-06 NOTE — ED Notes (Signed)
Per Roni, [facility] - pt has been accepted to [facility] - 302-2.

## 2019-04-06 NOTE — Progress Notes (Signed)
Inpatient Diabetes Program Recommendations  AACE/ADA: New Consensus Statement on Inpatient Glycemic Control (2015)  Target Ranges:  Prepandial:   less than 140 mg/dL      Peak postprandial:   less than 180 mg/dL (1-2 hours)      Critically ill patients:  140 - 180 mg/dL   Lab Results  Component Value Date   GLUCAP 338 (H) 04/06/2019   HGBA1C 10.4 (H) 01/28/2019    Review of Glycemic Control Results for REHAAN, VILORIA (MRN [id number]) as of 04/06/2019 09:38  Ref. Range 04/05/2019 08:03 04/05/2019 09:20 04/05/2019 11:13 04/05/2019 13:11 04/05/2019 17:33 04/05/2019 21:24 04/06/2019 06:53 04/06/2019 08:01  Glucose-Capillary Latest Ref Range: 70 - 99 mg/dL 517 (HH) 492 (H) 175 (H) 116 (H) 277 (H) 206 (H) 329 (H) 338 (H)    Diabetes history: DM 1 Outpatient Diabetes medications:  70/30 42 units bid Current orders for Inpatient glycemic control:  Novolog sensitive tid with meals, Novolog 70/30 mix 42 units bid  Inpatient Diabetes Program Recommendations:    Fasting glucose in the 300's.  Please add A1C to labs.   Consider increasing 70/30 to 48-50 units bid.  Thanks  Tama Headings RN, MSN, BC-ADM Inpatient Diabetes Coordinator Team Pager [phone] (8a-5p)

## 2019-04-06 NOTE — ED Notes (Signed)
Pt aware of tx plan - acceptance to [facility] - voiced understanding. Offered for pt to notify family/friend - declined.

## 2019-04-06 NOTE — ED Notes (Signed)
Pt asking for Suboxone. Advised pt will administer shortly. Voiced understanding.

## 2019-04-06 NOTE — BHH Suicide Risk Assessment (Signed)
[facility] Admission Suicide Risk Assessment   Total Time spent with patient: 45 minutes Principal Problem: <principal problem not specified> Diagnosis:  Active Problems:   MDD (major depressive disorder), recurrent episode, severe (HCC)   Opioid dependence with opioid-induced mood disorder (HCC)  Subjective Data: Readmitted despite problematic behaviors last admission  Continued Clinical Symptoms:    The "Alcohol Use Disorders Identification Test", Guidelines for Use in Primary Care, Second Edition.  World Pharmacologist [facility]). Score between 0-7:  no or low risk or alcohol related problems. Score between 8-15:  moderate risk of alcohol related problems. Score between 16-19:  high risk of alcohol related problems. Score 20 or above:  warrants further diagnostic evaluation for alcohol dependence and treatment.   CLINICAL FACTORS:   Alcohol/Substance Abuse/Dependencies  Musculoskeletal: Strength & Muscle Tone: within normal limits Gait & Station: normal Patient leans: N/A  Psychiatric Specialty Exam: Physical Exam  Nursing note and vitals reviewed. Constitutional: He appears well-developed and well-nourished.    Review of Systems  Constitutional: Positive for chills.  HENT: Negative.   Eyes: Negative.   Cardiovascular: Negative.   Gastrointestinal: Positive for diarrhea, nausea and vomiting.  Musculoskeletal: Positive for myalgias.  Neurological: Negative.     There were no vitals taken for this visit.There is no height or weight on file to calculate BMI.  General Appearance: Disheveled  Eye Contact:  Fair  Speech:  Clear and Coherent  Volume:  Increased  Mood:  Anxious  Affect:  Appropriate  Thought Process:  Coherent and Linear  Orientation:  Full (Time, Place, and Person)  Thought Content:  Logical  Suicidal Thoughts:  No  Homicidal Thoughts:  No  Memory:  3/3  Judgement:  Fair  Insight:   Fair  Psychomotor Activity:  Normal  Concentration:  Concentration:  Fair  Recall:  [employer] of Knowledge:  Fair  Language:  Fair  Akathisia:  Negative  Handed:  Right  AIMS (if indicated):     Assets:  Leisure Time Physical Health  ADL's:  Intact  Cognition:  WNL  Sleep:       COGNITIVE FEATURES THAT CONTRIBUTE TO RISK:  Polarized thinking    SUICIDE RISK:   Mild:  Suicidal ideation of limited frequency, intensity, duration, and specificity.  There are no identifiable plans, no associated intent, mild dysphoria and related symptoms, good self-control (both objective and subjective assessment), few other risk factors, and identifiable protective factors, including available and accessible social support.  PLAN OF CARE: see eval  I certify that inpatient services furnished can reasonably be expected to improve the patient's condition.   Johnn Hai, MD 04/06/2019, 1:13 PM

## 2019-04-06 NOTE — ED Notes (Signed)
Breakfast tray ordered 
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
## 2019-04-06 NOTE — BHH Group Notes (Signed)
[facility] Group Notes:  (Nursing/MHT/Case Management/Adjunct)  Date:  04/06/2019  Time:  2:45 PM  Type of Therapy:  Nurse Education  Participation Level:  Did Not Attend   [name] 04/06/2019, 5:13 PM

## 2019-04-06 NOTE — ED Notes (Signed)
Pt in shower.  
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
## 2019-04-06 NOTE — ED Notes (Signed)
[employer] transporting pt to [facility] belongings - 2 back packs, 1 labeled belongings bag w/razor - [employer].

## 2019-04-06 NOTE — BH Assessment (Signed)
[facility] Assessment Progress Note   Patient was seen for re-assessment.  He states that he has been depressed for the last four months and states that he has been having suicidal thoughts.  He states that he was clean for seven years and he has relapsed and states that he just does not care about anything anymore.  He states that he has not been taking his insulin as prescribed, he has not been bathing for five days at a time and he attempted suicidal last month.  Patient states that he is having thoughts about jumping off a bridge today and is unable to contract for safety.  Continued inpatient is recommended.

## 2019-04-07 LAB — GLUCOSE, CAPILLARY
Glucose-Capillary: 424 mg/dL — ABNORMAL HIGH (ref 70–99)
Glucose-Capillary: 376 mg/dL — ABNORMAL HIGH (ref 70–99)

## 2019-04-07 LAB — LIPID PANEL
Cholesterol: 85 mg/dL (ref 0–200)
HDL: 37 mg/dL — ABNORMAL LOW (ref >40)
LDL Cholesterol: 37 mg/dL (ref 0–99)
Total CHOL/HDL Ratio: 2.3 RATIO
Triglycerides: 54 mg/dL (ref <150)
VLDL: 11 mg/dL (ref 0–40)

## 2019-04-07 LAB — TSH: TSH: 1.108 u[IU]/mL (ref 0.350–4.500)

## 2019-04-07 MED ORDER — QUETIAPINE FUMARATE 100 MG PO TABS: 100.0000 mg | ORAL_TABLET | Freq: Three times a day (TID) | ORAL | 2 refills | Status: AC

## 2019-04-07 MED ORDER — INSULIN ASPART PROT & ASPART (70-30 MIX) 100 UNIT/ML ~~LOC~~ SUSP: 42.0000 [IU] | Freq: Two times a day (BID) | SUBCUTANEOUS | 11 refills | Status: AC

## 2019-04-07 MED ORDER — SERTRALINE HCL 100 MG PO TABS: 100.0000 mg | ORAL_TABLET | Freq: Every day | ORAL | 1 refills | Status: AC

## 2019-04-07 MED ORDER — TRAZODONE HCL 50 MG PO TABS
50.0000 mg | ORAL_TABLET | Freq: Once | ORAL | Status: AC
  Administered 2019-04-07: 01:00:00 50 mg via ORAL

## 2019-04-07 MED ORDER — CLONIDINE HCL 0.1 MG PO TABS: 0.1000 mg | ORAL_TABLET | Freq: Two times a day (BID) | ORAL | 0 refills | Status: AC

## 2019-04-07 NOTE — Progress Notes (Signed)
Recreation Therapy Notes  Date: 8.26.20 Time: 1000 Location: 500 Hall Dayroom  Group Topic: Self-Esteem  Goal Area(s) Addresses:  Patient will successfully identify positive attributes about themselves.  Patient will successfully identify benefit of improved self-esteem.   Intervention: Visual merchandiser, Building services engineer, Corporate investment banker, Immunologist  Activity: Collage.  Patients were to create a collage that represented them.  Patients could include things place they have or want to visit, things they want to accomplish, achievements they have made, etc.  Education:  Self-Esteem, Discharge Planning.   Education Outcome: Acknowledges education/In group clarification offered/Needs additional education  Clinical Observations/Feedback: Pt did not attend group.    Victorino Sparrow, LRT/CTRS         Victorino Sparrow A 04/07/2019 11:39 AM

## 2019-04-07 NOTE — Progress Notes (Signed)
  [facility] Adult Case Management Discharge Plan :  Will you be returning to the same living situation after discharge:  No.; [employer] At discharge, do you have transportation home?: Yes,  [employer] at 1:00pm Do you have the ability to pay for your medications: No.; [facility]  Release of information consent forms completed and in the chart;  Patient's signature needed at discharge.  Patient to Follow up at: Follow-up [facility] Follow up on 04/09/2019.   Why: Please attend your primary care appointment for your diabetes on Friday, 8/28 at 1:40p.  Please bring your photo ID and medications.  Contact information: [address] [place], [place] [address] ph: [phone] fx: [phone]       [facility] Follow up.   Why: Please follow up for mental health services during clinic's walk-in hours; Monday-Friday 8:00a-5:00p.  Be sure to bring your photo ID and medications.  Contact information: [address] [place], [place] [address] ph: [phone] fx: [phone]          Next level of care provider has access to [facility] [place] and Suicide Prevention discussed: Yes,  Pt declined     Has patient been referred to the Quitline?: Patient refused referral  Patient has been referred for addiction treatment: Yes  Trecia Rogers, LCSW 04/07/2019, 11:41 AM

## 2019-04-07 NOTE — BHH Group Notes (Signed)
Pt was invited but did not attend orientation/goals group. 

## 2019-04-07 NOTE — BHH Suicide Risk Assessment (Signed)
[facility] Discharge Suicide Risk Assessment   Principal Problem: Homelessness/substance abuse Discharge Diagnoses: Active Problems:   MDD (major depressive disorder), recurrent episode, severe (HCC)   Opioid dependence with opioid-induced mood disorder ([place])   Total Time spent with patient: 45 minutes  Musculoskeletal: Strength & Muscle Tone: within normal limits Gait & Station: normal Patient leans: N/A  Psychiatric Specialty Exam: ROS  Blood pressure 103/70, pulse (!) 111, temperature 98 F (36.7 C), temperature source Oral, resp. rate 18, height 5\' 9"  (1.753 m), weight 74.4 kg, SpO2 98 %.Body mass index is 24.22 kg/m.  General Appearance: Casual  Eye Contact::  Good  Speech:  Clear and Coherent409  Volume:  Normal  Mood:  Euthymic  Affect:  Congruent  Thought Process:  Coherent  Orientation:  Full (Time, Place, and Person)  Thought Content:  Logical  Suicidal Thoughts:  No  Homicidal Thoughts:  No  Memory:  Immediate;   Fair Recent;   Fair  Judgement:  Fair  Insight:  Fair  Psychomotor Activity:  Normal  Concentration:  Good  Recall:  Good  Fund of Knowledge:Good  Language: Good  Akathisia:  Negative  Handed:  Right  AIMS (if indicated):     Assets:  Communication Skills Desire for Improvement  Sleep:  Number of Hours: 5.5  Cognition: WNL  ADL's:  Intact   Mental Status Per Nursing Assessment::   On Admission:  Suicidal ideation indicated by patient  Demographic Factors:  Male  Loss Factors: Financial problems/change in socioeconomic status  Historical Factors: Impulsivity  Risk Reduction Factors:   Religious beliefs about death  Continued Clinical Symptoms:  Alcohol/Substance Abuse/Dependencies  Cognitive Features That Contribute To Risk:  None    Suicide Risk:  Minimal: No identifiable suicidal ideation.  Patients presenting with no risk factors but with morbid ruminations; may be classified as minimal risk based on the severity of the depressive  symptoms    Plan Of Care/Follow-up recommendations:  Activity:  full  Henreitta Spittler, MD 04/07/2019, 7:49 AM

## 2019-04-07 NOTE — Progress Notes (Signed)
Pt inside room 501. Staff advised pt he is not allow to visit in other pt.'s room. Pt said "I know, I know why are you telling me this." Pt appears to be angry. RN notify.

## 2019-04-07 NOTE — Discharge Summary (Signed)
Physician Discharge Summary Note  Patient:  Donald Hartman is an 33 y.o., male MRN:  [id number] DOB:  1986-07-09 Patient phone:  [phone] (home)  Patient address:   [place] [address],  Total Time spent with patient: 45 minutes  Date of Admission:  04/06/2019 Date of Discharge: 04/07/2019  Reason for Admission:   Patient is well-known to the psychiatry service, initially presenting on 8/23 with noncompliance stating unable to get his insulin, blood sugar 433 and requiring medical stabilization, then requesting detox measures stating he been abusing IV heroin, drug screen was however negative, but he endorsed suicidal thoughts and was referred for overall evaluation.   Principal Problem: Homelessness/issues of secondary gain/substance abuse Discharge Diagnoses: Active Problems:   MDD (major depressive disorder), recurrent episode, severe (HCC)   Opioid dependence with opioid-induced mood disorder ([place])   Past Psychiatric History: Multiple recent admissions [place] area  Past Medical History:  Past Medical History:  Diagnosis Date  . Diabetes mellitus without complication ([place])    History reviewed. No pertinent surgical history. Family History: History reviewed. No pertinent family history. Family Psychiatric  History: neg Social History:  Social History   Substance and Sexual Activity  Alcohol Use Yes     Social History   Substance and Sexual Activity  Drug Use Yes  . Types: IV, Methamphetamines   Comment: heroin, crack daily    Social History   Socioeconomic History  . Marital status: Single    Spouse name: Not on file  . Number of children: Not on file  . Years of education: Not on file  . Highest education level: Not on file  Occupational History  . Not on file  Social Needs  . Financial resource strain: Very hard  . Food insecurity    Worry: Often true    Inability: Often true  . Transportation needs    Medical: Yes   Non-medical: Yes  Tobacco Use  . Smoking status: Current Every Day Smoker  . Smokeless tobacco: Never Used  Substance and Sexual Activity  . Alcohol use: Yes  . Drug use: Yes    Types: IV, Methamphetamines    Comment: heroin, crack daily  . Sexual activity: Yes    Birth control/protection: None  Lifestyle  . Physical activity    Days per week: 0 days    Minutes per session: 0 min  . Stress: Very much  Relationships  . Social Herbalist on phone: Never    Gets together: Never    Attends religious service: Never    Active member of club or organization: No    Attends meetings of clubs or organizations: Not on file    Relationship status: Never married  Other Topics Concern  . Not on file  Social History Narrative  . Not on file    Hospital Course:    Patient was admitted under routine precautions we put him on a Catapres protocol rather than Suboxone as he was requesting, we continued his sertraline at the point of discharge we also added Seroquel to keep him rather contain behaviorally as he been somewhat problematic on his last admission very manipulative volatile so forth but he displayed no drainage fevers here.  By the morning of the 26th he was actually requesting discharge home to go to [place].  Apparently he did not want to stay if he could not receive Suboxone but he had no acute withdrawal symptoms vitals were generally stable.  And  he insisted he had no thoughts of harming self or others could contract fully.  Therefore I went ahead and discharge him at his request on full mental status exam as below, alert and oriented calm cooperative without thoughts of harming self or others and contracting fully.  He did request sample so he would not run out of insulin and sertraline and requested medication for opiate withdrawal so we did prescribe Catapres as well.   Musculoskeletal: Strength & Muscle Tone: within normal limits Gait & Station: normal Patient leans:  N/A  Psychiatric Specialty Exam: ROS  Blood pressure 103/70, pulse (!) 111, temperature 98 F (36.7 C), temperature source Oral, resp. rate 18, height '5\' 9"'  (1.753 m), weight 74.4 kg, SpO2 98 %.Body mass index is 24.22 kg/m.  General Appearance: Casual  Eye Contact::  Good  Speech:  Clear and Coherent409  Volume:  Normal  Mood:  Euthymic  Affect:  Congruent  Thought Process:  Coherent  Orientation:  Full (Time, Place, and Person)  Thought Content:  Logical  Suicidal Thoughts:  No  Homicidal Thoughts:  No  Memory:  Immediate;   Fair Recent;   Fair  Judgement:  Fair  Insight:  Fair  Psychomotor Activity:  Normal  Concentration:  Good  Recall:  Good  Fund of Knowledge:Good  Language: Good  Akathisia:  Negative  Handed:  Right  AIMS (if indicated):     Assets:  Communication Skills Desire for Improvement  Sleep:  Number of Hours: 5.5  Cognition: WNL  ADL's:  Intact        Has this patient used any form of tobacco in the last 30 days? (Cigarettes, Smokeless Tobacco, Cigars, and/or Pipes) Yes, No  Blood Alcohol level:  Lab Results  Component Value Date   ETH 82 (H) 04/05/2019   ETH <10 99/83/3825    Metabolic Disorder Labs:  Lab Results  Component Value Date   HGBA1C 10.4 (H) 01/28/2019   MPG 251.78 01/28/2019   No results found for: PROLACTIN Lab Results  Component Value Date   CHOL 96 02/03/2019   TRIG 113 02/03/2019   HDL 47 02/03/2019   CHOLHDL 2.0 02/03/2019   VLDL 23 02/03/2019   [place] 26 02/03/2019    See Psychiatric Specialty Exam and Suicide Risk Assessment completed by Attending Physician prior to discharge.  Discharge destination:  Home  Is patient on multiple antipsychotic therapies at discharge:  No   Has Patient had three or more failed trials of antipsychotic monotherapy by history:  No  Recommended Plan for Multiple Antipsychotic Therapies: NA   Allergies as of 04/07/2019      Reactions   Haldol [haloperidol Lactate] Other (See  Comments)   Tongue swells and goes back of throat      Medication List    STOP taking these medications   ARIPiprazole 5 MG tablet Commonly known as: ABILIFY   buPROPion HCl ER (XL) 450 MG Tb24   insulin aspart 100 UNIT/ML injection Commonly known as: novoLOG   Suboxone 8-2 MG Film Generic drug: Buprenorphine HCl-Naloxone HCl   traZODone 100 MG tablet Commonly known as: DESYREL   True Metrix Blood Glucose Test test strip Generic drug: glucose blood   True Metrix Meter w/Device Kit     TAKE these medications     Indication  blood glucose meter kit and supplies Kit Dispense based on patient and insurance preference. Use up to four times daily as directed. (FOR ICD-9 250.00, 250.01).  Indication: 21-Hydroxylase Deficiency   cloNIDine  0.1 MG tablet Commonly known as: CATAPRES Take 1 tablet (0.1 mg total) by mouth 2 (two) times daily for 5 days.  Indication: Opiate Withdrawal   insulin aspart protamine- aspart (70-30) 100 UNIT/ML injection Commonly known as: NOVOLOG MIX 70/30 Inject 0.42 mLs (42 Units total) into the skin 2 (two) times daily with a meal.  Indication: Insulin-Dependent Diabetes   Insulin Syringe-Needle U-100 30G X 15/64" 0.5 ML Misc 1 Syringe by Does not apply route daily. 1 Insulin syringe-needle, 5 times daily  Indication: 21-Hydroxylase Deficiency   iron polysaccharides 150 MG capsule Commonly known as: NIFEREX Take 1 capsule (150 mg total) by mouth daily.  Indication: Anemia From Inadequate Iron in the Body   QUEtiapine 100 MG tablet Commonly known as: SEROQUEL Take 1 tablet (100 mg total) by mouth 3 (three) times daily.  Indication: Depressive Phase of Manic-Depression   sertraline 100 MG tablet Commonly known as: ZOLOFT Take 1 tablet (100 mg total) by mouth daily.  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

## 2019-04-07 NOTE — Progress Notes (Signed)
Pt d/c from the hospital with [facility] to [facility]. All items returned. D/C instructions given, prescriptions given and samples given. Pt denies si and hi.

## 2019-04-07 NOTE — BHH Suicide Risk Assessment (Signed)
[facility] INPATIENT:  Family/Significant Other Suicide Prevention Education  Suicide Prevention Education:  Patient Refusal for Family/Significant Other Suicide Prevention Education: The patient Donald Hartman has refused to provide written consent for family/significant other to be provided Family/Significant Other Suicide Prevention Education during admission and/or prior to discharge.  Physician notified.  Trecia Rogers 04/07/2019, 11:41 AM

## 2019-04-07 NOTE — Progress Notes (Signed)
Patient ID: Donald Hartman, male   DOB: [birth year], 33 y.o.   MRN: [id number]   CSW scheduled [facility] for 1pm to [facility]. Pt's nurse was notifed.   CSW printed out information about [employer], printed out pt's negative COVID test results and printed out the facesheet in case pt does not have an photo ID on hand.

## 2019-04-07 NOTE — Progress Notes (Signed)
Pt CBG 424 pt received 9 units  Per MAR. NP notified- continue to monitor. Pt may need to be evaluated for diabetic consult.

## 2019-04-07 NOTE — Tx Team (Addendum)
Interdisciplinary Treatment and Diagnostic Plan Update  04/07/2019 Time of Session: 10:20am Tamario Heal MRN: [id number]  Principal Diagnosis: <principal problem not specified>  Secondary Diagnoses: Active Problems:   MDD (major depressive disorder), recurrent episode, severe (HCC)   Opioid dependence with opioid-induced mood disorder (HCC)   Current Medications:  Current Facility-Administered Medications  Medication Dose Route Frequency Provider Last Rate Last Dose  . acetaminophen (TYLENOL) tablet 650 mg  650 mg Oral Q6H PRN Suella Broad, FNP      . alum & mag hydroxide-simeth (MAALOX/MYLANTA) 200-200-20 MG/5ML suspension 30 mL  30 mL Oral Q4H PRN Starkes-Perry, Gayland Curry, FNP      . cloNIDine (CATAPRES) tablet 0.1 mg  0.1 mg Oral QID Johnn Hai, MD   0.1 mg at 04/07/19 0488   Followed by  . [START ON 04/08/2019] cloNIDine (CATAPRES) tablet 0.1 mg  0.1 mg Oral Celine Ahr, MD       Followed by  . [START ON 04/10/2019] cloNIDine (CATAPRES) tablet 0.1 mg  0.1 mg Oral QAC breakfast Johnn Hai, MD      . dicyclomine (BENTYL) tablet 20 mg  20 mg Oral Q6H PRN Suella Broad, FNP      . hydrOXYzine (ATARAX/VISTARIL) tablet 25 mg  25 mg Oral Q6H PRN Suella Broad, FNP   25 mg at 04/06/19 2104  . insulin aspart (novoLOG) injection 0-9 Units  0-9 Units Subcutaneous TID WC Suella Broad, FNP   9 Units at 04/07/19 0618  . insulin aspart protamine- aspart (NOVOLOG MIX 70/30) injection 42 Units  42 Units Subcutaneous BID WC Suella Broad, FNP   42 Units at 04/07/19 8916  . iron polysaccharides (NIFEREX) capsule 150 mg  150 mg Oral Daily Suella Broad, FNP   150 mg at 04/07/19 0830  . loperamide (IMODIUM) capsule 2-4 mg  2-4 mg Oral PRN Starkes-Perry, Gayland Curry, FNP      . magnesium hydroxide (MILK OF MAGNESIA) suspension 30 mL  30 mL Oral Daily PRN Starkes-Perry, Gayland Curry, FNP      . methocarbamol (ROBAXIN) tablet 500 mg  500 mg Oral  Q8H PRN Suella Broad, FNP   500 mg at 04/07/19 9450  . naproxen (NAPROSYN) tablet 500 mg  500 mg Oral BID PRN Suella Broad, FNP   500 mg at 04/07/19 0620  . ondansetron (ZOFRAN-ODT) disintegrating tablet 4 mg  4 mg Oral Q6H PRN Suella Broad, FNP   4 mg at 04/06/19 2104  . ondansetron (ZOFRAN-ODT) disintegrating tablet 8 mg  8 mg Oral TID Johnn Hai, MD   8 mg at 04/07/19 3888  . QUEtiapine (SEROQUEL) tablet 100 mg  100 mg Oral TID Johnn Hai, MD   100 mg at 04/07/19 0829  . sertraline (ZOLOFT) tablet 100 mg  100 mg Oral Daily Suella Broad, FNP   100 mg at 04/07/19 0830  . traZODone (DESYREL) tablet 100 mg  100 mg Oral QHS PRN Suella Broad, FNP   100 mg at 04/06/19 2103   PTA Medications: Medications Prior to Admission  Medication Sig Dispense Refill Last Dose  . ARIPiprazole (ABILIFY) 5 MG tablet Take 1 tablet (5 mg total) by mouth daily. 30 tablet 0   . blood glucose meter kit and supplies KIT Dispense based on patient and insurance preference. Use up to four times daily as directed. (FOR ICD-9 250.00, 250.01). 1 each 0   . Blood Glucose Monitoring Suppl (TRUE METRIX METER) w/Device KIT Use to  check your blood sugars 1 kit 0   . Buprenorphine HCl-Naloxone HCl (SUBOXONE) 8-2 MG FILM Place 1 Film under the tongue 2 (two) times daily.     Marland Kitchen buPROPion 450 MG TB24 Take 450 mg by mouth daily. 30 tablet 0   . glucose blood (TRUE METRIX BLOOD GLUCOSE TEST) test strip Use as instructed 100 each 1   . insulin aspart (NOVOLOG) 100 UNIT/ML injection Inject 0-20 Units into the skin 3 (three) times daily with meals. (Patient taking differently: Inject 0-20 Units into the skin See admin instructions. Sliding Scale: If Blood Sugar is 150=4u, 151-200=6u, 201-250=8u, 251-300=10u, 301-350=12u, 351-400=14u) 20 mL 0   . insulin aspart protamine- aspart (NOVOLOG MIX 70/30) (70-30) 100 UNIT/ML injection Inject 0.42 mLs (42 Units total) into the skin 2 (two) times  daily with a meal. 25 mL 0   . Insulin Syringe-Needle U-100 30G X 15/64" 0.5 ML MISC 1 Syringe by Does not apply route daily. 1 Insulin syringe-needle, 5 times daily 120 each 0   . iron polysaccharides (NIFEREX) 150 MG capsule Take 1 capsule (150 mg total) by mouth daily. 30 capsule 0   . traZODone (DESYREL) 100 MG tablet Take 1 tablet (100 mg total) by mouth at bedtime as needed for sleep. 30 tablet 0   . [DISCONTINUED] sertraline (ZOLOFT) 100 MG tablet Take 1 tablet (100 mg total) by mouth daily. 30 tablet 0     Patient Stressors:    Patient Strengths:    Treatment Modalities: Medication Management, Group therapy, Case management,  1 to 1 session with clinician, Psychoeducation, Recreational therapy.   Physician Treatment Plan for Primary Diagnosis: <principal problem not specified> Long Term Goal(s): Improvement in symptoms so as ready for discharge Improvement in symptoms so as ready for discharge   Short Term Goals: Ability to disclose and discuss suicidal ideas Ability to demonstrate self-control will improve Ability to identify and develop effective coping behaviors will improve Ability to maintain clinical measurements within normal limits will improve Ability to maintain clinical measurements within normal limits will improve Compliance with prescribed medications will improve Ability to identify triggers associated with substance abuse/mental health issues will improve  Medication Management: Evaluate patient's response, side effects, and tolerance of medication regimen.  Therapeutic Interventions: 1 to 1 sessions, Unit Group sessions and Medication administration.  Evaluation of Outcomes: Adequate for Discharge  Physician Treatment Plan for Secondary Diagnosis: Active Problems:   MDD (major depressive disorder), recurrent episode, severe (HCC)   Opioid dependence with opioid-induced mood disorder ([place])  Long Term Goal(s): Improvement in symptoms so as ready for  discharge Improvement in symptoms so as ready for discharge   Short Term Goals: Ability to disclose and discuss suicidal ideas Ability to demonstrate self-control will improve Ability to identify and develop effective coping behaviors will improve Ability to maintain clinical measurements within normal limits will improve Ability to maintain clinical measurements within normal limits will improve Compliance with prescribed medications will improve Ability to identify triggers associated with substance abuse/mental health issues will improve     Medication Management: Evaluate patient's response, side effects, and tolerance of medication regimen.  Therapeutic Interventions: 1 to 1 sessions, Unit Group sessions and Medication administration.  Evaluation of Outcomes: Adequate for Discharge   RN Treatment Plan for Primary Diagnosis: <principal problem not specified> Long Term Goal(s): Knowledge of disease and therapeutic regimen to maintain health will improve  Short Term Goals: Ability to participate in decision making will improve, Ability to verbalize feelings will improve, Ability to  disclose and discuss suicidal ideas, Ability to identify and develop effective coping behaviors will improve and Compliance with prescribed medications will improve  Medication Management: RN will administer medications as ordered by provider, will assess and evaluate patient's response and provide education to patient for prescribed medication. RN will report any adverse and/or side effects to prescribing provider.  Therapeutic Interventions: 1 on 1 counseling sessions, Psychoeducation, Medication administration, Evaluate responses to treatment, Monitor vital signs and CBGs as ordered, Perform/monitor CIWA, COWS, AIMS and Fall Risk screenings as ordered, Perform wound care treatments as ordered.  Evaluation of Outcomes: Adequate for Discharge   LCSW Treatment Plan for Primary Diagnosis: <principal problem  not specified> Long Term Goal(s): Safe transition to appropriate next level of care at discharge, Engage patient in therapeutic group addressing interpersonal concerns.  Short Term Goals: Engage patient in aftercare planning with referrals and resources and Increase skills for wellness and recovery  Therapeutic Interventions: Assess for all discharge needs, 1 to 1 time with Social worker, Explore available resources and support systems, Assess for adequacy in community support network, Educate family and significant other(s) on suicide prevention, Complete Psychosocial Assessment, Interpersonal group therapy.  Evaluation of Outcomes: Adequate for Discharge   Progress in Treatment: Attending groups: No. Participating in groups: No. Taking medication as prescribed: Yes. Toleration medication: Yes. Family/Significant other contact made: Yes, individual(s) contacted:  pt declined Patient understands diagnosis: No. Discussing patient identified problems/goals with staff: Yes. Medical problems stabilized or resolved: Yes. Denies suicidal/homicidal ideation: Yes. Issues/concerns per patient self-inventory: No. Other:   New problem(s) identified: No, Describe:  None  New Short Term/Long Term Goal(s): Medication stabilization, elimination of SI thoughts, and development of a comprehensive mental wellness plan.   Patient Goals:  "Get a job"  Discharge Plan or Barriers: Patient will be discharging to [facility] and will be obtaining aftercare with [facility] for his primary care physician (PCP) and [facility] for mental health services.   Reason for Continuation of Hospitalization: Patient is discharging today.   Estimated Length of Stay: Patient is discharging today.   Attendees: Patient: Donald Hartman  04/07/2019   Physician: Dr. Johnn Hai, MD 04/07/2019  Nursing: Mary Sella, RN 04/07/2019   RN Care Manager: 04/07/2019  Social Worker: Ardelle Anton, LCSW  04/07/2019   Recreational Therapist:  04/07/2019   Other: Medical Student: Jonelle Sidle 04/07/2019  Other: MSW Intern: Jillyn Ledger 04/07/2019   Other: 04/07/2019      Scribe for Treatment Team: Trecia Rogers, LCSW 04/07/2019 11:21 AM

## 2019-04-07 NOTE — Care Management (Signed)
CMA spoke with Candy in Admissions at [facility]. Patient's referral has not been reviewed.   Patient is due to discharge Wednesday, 8/26. Patient will have to follow up with facility.   CMA has notified CSW, Ardelle Anton.     Alanis Clift Care Management Assistant  Email:[email or web address] Office: [phone]
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
